# Patient Record
Sex: Female | Born: 1935 | Race: White | Hispanic: No | Marital: Married | Smoking: Never smoker
Health system: Southern US, Community
[De-identification: ages and names within clinical notes are randomized; demographics above are authoritative.]

## PROBLEM LIST (undated history)

## (undated) DIAGNOSIS — N3090 Cystitis, unspecified without hematuria: Secondary | ICD-10-CM

## (undated) DIAGNOSIS — F0280 Dementia in other diseases classified elsewhere without behavioral disturbance: Secondary | ICD-10-CM

## (undated) DIAGNOSIS — K219 Gastro-esophageal reflux disease without esophagitis: Secondary | ICD-10-CM

## (undated) DIAGNOSIS — I10 Essential (primary) hypertension: Secondary | ICD-10-CM

## (undated) DIAGNOSIS — I471 Supraventricular tachycardia: Secondary | ICD-10-CM

## (undated) DIAGNOSIS — F419 Anxiety disorder, unspecified: Secondary | ICD-10-CM

## (undated) DIAGNOSIS — G2581 Restless legs syndrome: Secondary | ICD-10-CM

## (undated) HISTORY — PX: ABDOMINAL HYSTERECTOMY: SHX81

## (undated) HISTORY — PX: NASAL SINUS SURGERY: SHX719

---

## 2004-11-05 ENCOUNTER — Ambulatory Visit: Payer: Self-pay | Admitting: Internal Medicine

## 2004-11-08 ENCOUNTER — Ambulatory Visit: Payer: Self-pay | Admitting: Internal Medicine

## 2005-07-28 ENCOUNTER — Ambulatory Visit: Payer: Self-pay | Admitting: Internal Medicine

## 2005-09-11 ENCOUNTER — Ambulatory Visit: Payer: Self-pay | Admitting: Internal Medicine

## 2005-10-29 ENCOUNTER — Ambulatory Visit: Payer: Self-pay | Admitting: Unknown Physician Specialty

## 2006-01-29 ENCOUNTER — Ambulatory Visit: Payer: Self-pay | Admitting: Pain Medicine

## 2006-09-22 ENCOUNTER — Ambulatory Visit: Payer: Self-pay | Admitting: Internal Medicine

## 2006-10-01 ENCOUNTER — Observation Stay (HOSPITAL_COMMUNITY): Admission: EM | Admit: 2006-10-01 | Discharge: 2006-10-02 | Payer: Self-pay | Admitting: Emergency Medicine

## 2007-11-24 ENCOUNTER — Ambulatory Visit: Payer: Self-pay

## 2008-05-17 ENCOUNTER — Ambulatory Visit: Payer: Self-pay | Admitting: Internal Medicine

## 2008-06-29 ENCOUNTER — Ambulatory Visit: Payer: Self-pay | Admitting: Unknown Physician Specialty

## 2008-08-21 ENCOUNTER — Ambulatory Visit: Payer: Self-pay | Admitting: Unknown Physician Specialty

## 2009-02-26 ENCOUNTER — Ambulatory Visit: Payer: Self-pay | Admitting: Otolaryngology

## 2009-02-28 ENCOUNTER — Ambulatory Visit: Payer: Self-pay | Admitting: Otolaryngology

## 2010-06-25 ENCOUNTER — Ambulatory Visit: Payer: Self-pay | Admitting: Internal Medicine

## 2011-07-07 ENCOUNTER — Emergency Department: Payer: Self-pay | Admitting: Unknown Physician Specialty

## 2011-10-11 ENCOUNTER — Emergency Department: Payer: Self-pay | Admitting: *Deleted

## 2014-04-19 DIAGNOSIS — M810 Age-related osteoporosis without current pathological fracture: Secondary | ICD-10-CM | POA: Insufficient documentation

## 2014-05-23 ENCOUNTER — Ambulatory Visit: Payer: Self-pay | Admitting: Internal Medicine

## 2014-07-26 DIAGNOSIS — M519 Unspecified thoracic, thoracolumbar and lumbosacral intervertebral disc disorder: Secondary | ICD-10-CM | POA: Insufficient documentation

## 2014-08-12 ENCOUNTER — Emergency Department: Payer: Self-pay | Admitting: Emergency Medicine

## 2014-08-12 LAB — CBC
HCT: 39.2 % (ref 35.0–47.0)
HGB: 13.3 g/dL (ref 12.0–16.0)
MCH: 28.8 pg (ref 26.0–34.0)
MCHC: 33.8 g/dL (ref 32.0–36.0)
MCV: 85 fL (ref 80–100)
PLATELETS: 223 10*3/uL (ref 150–440)
RBC: 4.61 10*6/uL (ref 3.80–5.20)
RDW: 13.3 % (ref 11.5–14.5)
WBC: 7.8 10*3/uL (ref 3.6–11.0)

## 2014-08-12 LAB — BASIC METABOLIC PANEL
Anion Gap: 6 — ABNORMAL LOW (ref 7–16)
BUN: 22 mg/dL — ABNORMAL HIGH (ref 7–18)
CHLORIDE: 104 mmol/L (ref 98–107)
CO2: 30 mmol/L (ref 21–32)
CREATININE: 0.87 mg/dL (ref 0.60–1.30)
Calcium, Total: 8.5 mg/dL (ref 8.5–10.1)
EGFR (African American): >60
EGFR (Non-African Amer.): >60
GLUCOSE: 101 mg/dL — AB (ref 65–99)
OSMOLALITY: 283 (ref 275–301)
Potassium: 3.7 mmol/L (ref 3.5–5.1)
Sodium: 140 mmol/L (ref 136–145)

## 2015-06-12 DIAGNOSIS — D414 Neoplasm of uncertain behavior of bladder: Secondary | ICD-10-CM | POA: Insufficient documentation

## 2015-09-27 DIAGNOSIS — R339 Retention of urine, unspecified: Secondary | ICD-10-CM | POA: Insufficient documentation

## 2015-10-25 DIAGNOSIS — R300 Dysuria: Secondary | ICD-10-CM | POA: Insufficient documentation

## 2016-02-13 DIAGNOSIS — N3010 Interstitial cystitis (chronic) without hematuria: Secondary | ICD-10-CM | POA: Insufficient documentation

## 2016-06-16 DIAGNOSIS — M1990 Unspecified osteoarthritis, unspecified site: Secondary | ICD-10-CM | POA: Insufficient documentation

## 2016-08-11 DIAGNOSIS — Z0000 Encounter for general adult medical examination without abnormal findings: Secondary | ICD-10-CM | POA: Insufficient documentation

## 2016-12-09 DIAGNOSIS — M419 Scoliosis, unspecified: Secondary | ICD-10-CM | POA: Insufficient documentation

## 2017-09-22 DIAGNOSIS — I471 Supraventricular tachycardia: Secondary | ICD-10-CM | POA: Insufficient documentation

## 2018-06-15 DIAGNOSIS — F02818 Dementia in other diseases classified elsewhere, unspecified severity, with other behavioral disturbance: Secondary | ICD-10-CM | POA: Insufficient documentation

## 2018-06-15 DIAGNOSIS — F0281 Dementia in other diseases classified elsewhere with behavioral disturbance: Secondary | ICD-10-CM | POA: Insufficient documentation

## 2018-06-15 DIAGNOSIS — G309 Alzheimer's disease, unspecified: Secondary | ICD-10-CM | POA: Insufficient documentation

## 2018-07-01 ENCOUNTER — Other Ambulatory Visit: Payer: Self-pay

## 2018-07-01 ENCOUNTER — Observation Stay
Admission: EM | Admit: 2018-07-01 | Discharge: 2018-07-05 | Disposition: A | Discharge status: Skilled Nursing Facility | Payer: Medicare Other | Attending: Internal Medicine | Admitting: Internal Medicine

## 2018-07-01 ENCOUNTER — Emergency Department: Payer: Medicare Other

## 2018-07-01 DIAGNOSIS — Z791 Long term (current) use of non-steroidal anti-inflammatories (NSAID): Secondary | ICD-10-CM | POA: Insufficient documentation

## 2018-07-01 DIAGNOSIS — Z66 Do not resuscitate: Secondary | ICD-10-CM | POA: Diagnosis not present

## 2018-07-01 DIAGNOSIS — B965 Pseudomonas (aeruginosa) (mallei) (pseudomallei) as the cause of diseases classified elsewhere: Secondary | ICD-10-CM | POA: Insufficient documentation

## 2018-07-01 DIAGNOSIS — Z79899 Other long term (current) drug therapy: Secondary | ICD-10-CM | POA: Insufficient documentation

## 2018-07-01 DIAGNOSIS — M79672 Pain in left foot: Secondary | ICD-10-CM | POA: Insufficient documentation

## 2018-07-01 DIAGNOSIS — K219 Gastro-esophageal reflux disease without esophagitis: Secondary | ICD-10-CM | POA: Diagnosis not present

## 2018-07-01 DIAGNOSIS — F419 Anxiety disorder, unspecified: Secondary | ICD-10-CM | POA: Insufficient documentation

## 2018-07-01 DIAGNOSIS — R296 Repeated falls: Secondary | ICD-10-CM | POA: Diagnosis present

## 2018-07-01 DIAGNOSIS — M79605 Pain in left leg: Secondary | ICD-10-CM

## 2018-07-01 DIAGNOSIS — I10 Essential (primary) hypertension: Secondary | ICD-10-CM | POA: Diagnosis not present

## 2018-07-01 DIAGNOSIS — M7989 Other specified soft tissue disorders: Secondary | ICD-10-CM

## 2018-07-01 DIAGNOSIS — S83282A Other tear of lateral meniscus, current injury, left knee, initial encounter: Secondary | ICD-10-CM | POA: Insufficient documentation

## 2018-07-01 DIAGNOSIS — M25562 Pain in left knee: Secondary | ICD-10-CM | POA: Diagnosis present

## 2018-07-01 DIAGNOSIS — S83242A Other tear of medial meniscus, current injury, left knee, initial encounter: Principal | ICD-10-CM | POA: Insufficient documentation

## 2018-07-01 DIAGNOSIS — N390 Urinary tract infection, site not specified: Secondary | ICD-10-CM | POA: Diagnosis present

## 2018-07-01 DIAGNOSIS — M1712 Unilateral primary osteoarthritis, left knee: Secondary | ICD-10-CM | POA: Insufficient documentation

## 2018-07-01 DIAGNOSIS — W19XXXA Unspecified fall, initial encounter: Secondary | ICD-10-CM | POA: Insufficient documentation

## 2018-07-01 DIAGNOSIS — G2581 Restless legs syndrome: Secondary | ICD-10-CM | POA: Diagnosis present

## 2018-07-01 DIAGNOSIS — N3090 Cystitis, unspecified without hematuria: Secondary | ICD-10-CM | POA: Insufficient documentation

## 2018-07-01 HISTORY — DX: Gastro-esophageal reflux disease without esophagitis: K21.9

## 2018-07-01 HISTORY — DX: Cystitis, unspecified without hematuria: N30.90

## 2018-07-01 HISTORY — DX: Essential (primary) hypertension: I10

## 2018-07-01 HISTORY — DX: Restless legs syndrome: G25.81

## 2018-07-01 MED ORDER — ALPRAZOLAM 0.5 MG PO TABS
0.5000 mg | ORAL_TABLET | Freq: Once | ORAL | Status: AC
  Administered 2018-07-01: 0.5 mg via ORAL
  Filled 2018-07-01: qty 1

## 2018-07-01 MED ORDER — PRAMIPEXOLE DIHYDROCHLORIDE 0.25 MG PO TABS
0.2500 mg | ORAL_TABLET | Freq: Three times a day (TID) | ORAL | Status: DC
  Administered 2018-07-01 – 2018-07-02 (×2): 0.25 mg via ORAL
  Filled 2018-07-01 (×3): qty 1

## 2018-07-01 MED ORDER — TRAMADOL HCL 50 MG PO TABS
100.0000 mg | ORAL_TABLET | Freq: Once | ORAL | Status: AC
  Administered 2018-07-01: 100 mg via ORAL
  Filled 2018-07-01: qty 2

## 2018-07-01 NOTE — ED Notes (Signed)
Pt reports left knee/calf pain that began 3 days ago. Pt denies recent injury. Mild swelling to left calf. Pt denies blood thinners. Pt reports falling a couple months ago but was not injured in the fall. Pt unable to bear weight on her leg. Pt had imaging yesterday and was told she had a small patellar fracture to left knee. Pt daughter is at the bedside and reports that the pt lives at home with her husband who is also elderly and is a fall risk as well. The daughter is concerned for the pt safety at home. Notified MD Archie Balboa of families concern.

## 2018-07-01 NOTE — ED Provider Notes (Signed)
[facility] Emergency Department Provider Note  ____________________________________________   I have reviewed the triage vital signs and the nursing notes.   HISTORY  Chief Complaint Knee Pain   History limited by: Not Limited, some history obtained from family   HPI Paige Vasquez is a 84 y.o. female who presents to the emergency department today because of concerns for left leg pain and desire for rehab placement.  Family states that the patient had a fall a couple of weeks ago.  Had had some difficulty with ambulation however the past 2 or 3 days have been acutely worse.  Family also noticed some swelling to the left leg.  Got outpatient x-rays done which were concerning for patellar fracture.  Was seen at [facility] emergency department earlier today who recommended orthopedic follow-up.  Patient does not have any history of blood clots.   Per medical record review patient has a history of cystitis  Past Medical History:  Diagnosis Date  . Cystitis     There are no active problems to display for this patient.   History reviewed. No pertinent surgical history.  Prior to Admission medications   Not on File    Allergies Patient has no known allergies.  No family history on file.  Social History Social History   Tobacco Use  . Smoking status: Never Smoker  . Smokeless tobacco: Never Used  Substance Use Topics  . Alcohol use: Not on file  . Drug use: Not on file    Review of Systems Constitutional: No fever/chills Eyes: No visual changes. ENT: No sore throat. Cardiovascular: Denies chest pain. Respiratory: Denies shortness of breath. Gastrointestinal: No abdominal pain.  No nausea, no vomiting.  No diarrhea.   Genitourinary: Negative for dysuria. Musculoskeletal: Positive for left lower leg pain Skin: Negative for rash. Neurological: Negative for headaches, focal weakness or  numbness.  ____________________________________________   PHYSICAL EXAM:  VITAL SIGNS: ED Triage Vitals  Enc Vitals Group     BP 07/01/18 2030 (!) 177/52     Pulse Rate 07/01/18 2030 66     Resp 07/01/18 2030 18     Temp 07/01/18 2030 98.2 F (36.8 C)     Temp Source 07/01/18 2030 Oral     SpO2 07/01/18 2030 97 %     Weight 07/01/18 2031 130 lb (59 kg)     Height 07/01/18 2031 5\' 3"  (1.6 m)     Head Circumference --      Peak Flow --      Pain Score 07/01/18 2030 9   Constitutional: Alert and oriented.  Eyes: Conjunctivae are normal.  ENT      Head: Normocephalic and atraumatic.      Nose: No congestion/rhinnorhea.      Mouth/Throat: Mucous membranes are moist.      Neck: No stridor. Hematological/Lymphatic/Immunilogical: No cervical lymphadenopathy. Cardiovascular: Normal rate, regular rhythm.  No murmurs, rubs, or gallops.  Respiratory: Normal respiratory effort without tachypnea nor retractions. Breath sounds are clear and equal bilaterally. No wheezes/rales/rhonchi. Gastrointestinal: Soft and non tender. No rebound. No guarding.  Genitourinary: Deferred Musculoskeletal: Normal range of motion in all extremities.  2+ left lower leg edema some tenderness to palpation of the left lower leg Neurologic:  Normal speech and language. No gross focal neurologic deficits are appreciated.  Skin:  Skin is warm, dry and intact. No rash noted. Psychiatric: Mood and affect are normal. Speech and behavior are normal. Patient exhibits appropriate insight and judgment.  ____________________________________________  LABS (pertinent positives/negatives)  Labs pending  ____________________________________________   EKG  None  ____________________________________________    RADIOLOGY  Left knee No obvious fracture  [place] No DVT   ____________________________________________   PROCEDURES  Procedures  ____________________________________________   INITIAL  IMPRESSION / ASSESSMENT AND PLAN / ED COURSE  Pertinent labs & imaging results that were available during my care of the patient were reviewed by me and considered in my medical decision making (see chart for details).   Patient presented to the emergency department accompanied by family because concerns for placement after diagnosis of left patellar fracture.  On exam patient does have swelling to her left lower leg.  It is more than I would expect with just patellar fracture.  X-rays here do not show any obvious acute fracture.  Ultrasound was obtained to assess for DVT.  This also was negative.  While she was here in the emergency department started complaining of urinary burning.  Urine and blood work was obtained.  Discussed with family that if we do not find any obvious etiology that would warrant admission we will have social work evaluate the morning.   ____________________________________________   FINAL CLINICAL IMPRESSION(S) / ED DIAGNOSES  Left leg pain Left leg swelling  Note: This dictation was prepared with Dragon dictation. Any transcriptional errors that result from this process are unintentional     Nance Pear, MD 07/02/18 0013

## 2018-07-01 NOTE — ED Triage Notes (Addendum)
Pt arrives to ED via POV from home with c/o left knee pain. Pt's daughter states pt was seen yesterday at [facility], had imaging performed, and told she "has a broken knee cap". Daughter states unknown when or how injury occurred. CMS intact in lower left leg, no obvious deformity or dislocation noted.

## 2018-07-01 NOTE — ED Notes (Signed)
Pt reports burning with urination, UA obtained via bed pan. MD Archie Balboa made aware of pt complaints.

## 2018-07-02 ENCOUNTER — Encounter: Payer: Self-pay | Admitting: Internal Medicine

## 2018-07-02 ENCOUNTER — Other Ambulatory Visit: Payer: Self-pay

## 2018-07-02 ENCOUNTER — Observation Stay: Payer: Medicare Other

## 2018-07-02 DIAGNOSIS — G2581 Restless legs syndrome: Secondary | ICD-10-CM | POA: Diagnosis present

## 2018-07-02 DIAGNOSIS — N390 Urinary tract infection, site not specified: Secondary | ICD-10-CM | POA: Diagnosis present

## 2018-07-02 DIAGNOSIS — I10 Essential (primary) hypertension: Secondary | ICD-10-CM | POA: Diagnosis present

## 2018-07-02 DIAGNOSIS — K219 Gastro-esophageal reflux disease without esophagitis: Secondary | ICD-10-CM | POA: Diagnosis present

## 2018-07-02 DIAGNOSIS — W19XXXA Unspecified fall, initial encounter: Secondary | ICD-10-CM | POA: Diagnosis present

## 2018-07-02 LAB — CBC WITH DIFFERENTIAL/PLATELET
BASOS ABS: 0 10*3/uL (ref 0–0.1)
BASOS PCT: 1 %
EOS ABS: 0 10*3/uL (ref 0–0.7)
EOS PCT: 0 %
HEMATOCRIT: 38.8 % (ref 35.0–47.0)
Hemoglobin: 13.4 g/dL (ref 12.0–16.0)
Lymphocytes Relative: 13 %
Lymphs Abs: 1.1 10*3/uL (ref 1.0–3.6)
MCH: 29.2 pg (ref 26.0–34.0)
MCHC: 34.5 g/dL (ref 32.0–36.0)
MCV: 84.8 fL (ref 80.0–100.0)
MONO ABS: 0.6 10*3/uL (ref 0.2–0.9)
Monocytes Relative: 7 %
NEUTROS ABS: 6.7 10*3/uL — AB (ref 1.4–6.5)
NEUTROS PCT: 79 %
Platelets: 215 10*3/uL (ref 150–440)
RBC: 4.58 MIL/uL (ref 3.80–5.20)
RDW: 13.1 % (ref 11.5–14.5)
WBC: 8.4 10*3/uL (ref 3.6–11.0)

## 2018-07-02 LAB — URINALYSIS, COMPLETE (UACMP) WITH MICROSCOPIC
Bilirubin Urine: NEGATIVE
GLUCOSE, UA: NEGATIVE mg/dL
Ketones, ur: NEGATIVE mg/dL
Nitrite: NEGATIVE
PH: 7 (ref 5.0–8.0)
Protein, ur: NEGATIVE mg/dL
SPECIFIC GRAVITY, URINE: 1.005 (ref 1.005–1.030)

## 2018-07-02 LAB — BASIC METABOLIC PANEL
Anion gap: 7 (ref 5–15)
BUN: 22 mg/dL (ref 8–23)
CHLORIDE: 105 mmol/L (ref 98–111)
CO2: 27 mmol/L (ref 22–32)
Calcium: 8.9 mg/dL (ref 8.9–10.3)
Creatinine, Ser: 0.79 mg/dL (ref 0.44–1.00)
GFR calc non Af Amer: >60 mL/min (ref >60)
Glucose, Bld: 94 mg/dL (ref 70–99)
Potassium: 3.6 mmol/L (ref 3.5–5.1)
Sodium: 139 mmol/L (ref 135–145)

## 2018-07-02 LAB — TROPONIN I: Troponin I: <0.03 ng/mL (ref <0.03)

## 2018-07-02 MED ORDER — MELOXICAM 7.5 MG PO TABS
7.5000 mg | ORAL_TABLET | Freq: Every day | ORAL | Status: DC
  Administered 2018-07-03 – 2018-07-05 (×3): 7.5 mg via ORAL
  Filled 2018-07-02 (×4): qty 1

## 2018-07-02 MED ORDER — SODIUM CHLORIDE 0.9 % IV SOLN
1.0000 g | Freq: Once | INTRAVENOUS | Status: AC
  Administered 2018-07-02: 1 g via INTRAVENOUS
  Filled 2018-07-02: qty 10

## 2018-07-02 MED ORDER — TRAMADOL HCL 50 MG PO TABS
100.0000 mg | ORAL_TABLET | Freq: Two times a day (BID) | ORAL | Status: DC
  Administered 2018-07-03 – 2018-07-05 (×5): 100 mg via ORAL
  Filled 2018-07-02 (×5): qty 2

## 2018-07-02 MED ORDER — TRAZODONE HCL 50 MG PO TABS
50.0000 mg | ORAL_TABLET | Freq: Every evening | ORAL | Status: DC | PRN
  Administered 2018-07-02 – 2018-07-04 (×3): 50 mg via ORAL
  Filled 2018-07-02 (×3): qty 1

## 2018-07-02 MED ORDER — ONDANSETRON HCL 4 MG/2ML IJ SOLN: 4.0000 mg | Freq: Four times a day (QID) | INTRAMUSCULAR | Status: DC | PRN

## 2018-07-02 MED ORDER — ONDANSETRON HCL 4 MG PO TABS: 4.0000 mg | ORAL_TABLET | Freq: Four times a day (QID) | ORAL | Status: DC | PRN

## 2018-07-02 MED ORDER — SODIUM CHLORIDE 0.9 % IV SOLN
1.0000 g | INTRAVENOUS | Status: DC
  Administered 2018-07-03 – 2018-07-04 (×2): 1 g via INTRAVENOUS
  Filled 2018-07-02 (×2): qty 1

## 2018-07-02 MED ORDER — TRAZODONE HCL 100 MG PO TABS
100.0000 mg | ORAL_TABLET | Freq: Every day | ORAL | Status: DC
  Administered 2018-07-02: 100 mg via ORAL
  Filled 2018-07-02: qty 1

## 2018-07-02 MED ORDER — ACETAMINOPHEN 325 MG PO TABS
650.0000 mg | ORAL_TABLET | Freq: Four times a day (QID) | ORAL | Status: DC | PRN
Start: 2018-07-02 — End: 2018-07-05

## 2018-07-02 MED ORDER — PRAMIPEXOLE DIHYDROCHLORIDE 0.25 MG PO TABS
0.2500 mg | ORAL_TABLET | Freq: Two times a day (BID) | ORAL | Status: DC
  Administered 2018-07-02 – 2018-07-05 (×7): 0.25 mg via ORAL
  Filled 2018-07-02 (×7): qty 1

## 2018-07-02 MED ORDER — TRAMADOL HCL 50 MG PO TABS
100.0000 mg | ORAL_TABLET | Freq: Once | ORAL | Status: AC
  Administered 2018-07-02: 100 mg via ORAL
  Filled 2018-07-02: qty 2

## 2018-07-02 MED ORDER — ENOXAPARIN SODIUM 40 MG/0.4ML ~~LOC~~ SOLN
40.0000 mg | SUBCUTANEOUS | Status: DC
  Administered 2018-07-02 – 2018-07-04 (×3): 40 mg via SUBCUTANEOUS
  Filled 2018-07-02 (×3): qty 0.4

## 2018-07-02 MED ORDER — ALPRAZOLAM 0.25 MG PO TABS
0.2500 mg | ORAL_TABLET | Freq: Every day | ORAL | Status: DC | PRN
  Administered 2018-07-02 – 2018-07-05 (×4): 0.25 mg via ORAL
  Filled 2018-07-02 (×4): qty 1

## 2018-07-02 MED ORDER — ACETAMINOPHEN 650 MG RE SUPP: 650.0000 mg | Freq: Four times a day (QID) | RECTAL | Status: DC | PRN

## 2018-07-02 NOTE — ED Notes (Signed)
Pt unable to tolerate external catheter so it was removed, 234ml of urine in suction canister.

## 2018-07-02 NOTE — Progress Notes (Signed)
[facility]                                                                                                                                                                                  Patient Demographics   Paige Vasquez, is a 84 y.o. female, DOB - 24-Feb-1927, YTK:[id number]  Admit date - 07/01/2018   Admitting Physician Lance Coon, MD  Outpatient Primary MD for the patient is Rusty Aus, MD   LOS - 0  Subjective: Patient admitted with left knee pain and recurrent falls.  Noted to have a urinary tract infection Patient continues to have significant pain in the left leg at the knee x-ray negative for acute fracture  Review of Systems:   CONSTITUTIONAL: No documented fever. No fatigue, weakness. No weight gain, no weight loss.  EYES: No blurry or double vision.  ENT: No tinnitus. No postnasal drip. No redness of the oropharynx.  RESPIRATORY: No cough, no wheeze, no hemoptysis. No dyspnea.  CARDIOVASCULAR: No chest pain. No orthopnea. No palpitations. No syncope.  GASTROINTESTINAL: No nausea, no vomiting or diarrhea. No abdominal pain. No melena or hematochezia.  GENITOURINARY: No dysuria or hematuria.  ENDOCRINE: No polyuria or nocturia. No heat or cold intolerance.  HEMATOLOGY: No anemia. No bruising. No bleeding.  INTEGUMENTARY: No rashes. No lesions.  MUSCULOSKELETAL: No arthritis. No swelling. No gout.  Positive left knee pain NEUROLOGIC: No numbness, tingling, or ataxia. No seizure-type activity.  PSYCHIATRIC: No anxiety. No insomnia. No ADD.    Vitals:   Vitals:   07/02/18 0425 07/02/18 0550 07/02/18 0623 07/02/18 1145  BP: (!) 139/57 (!) 132/57 (!) 173/65 (!) 141/70  Pulse: (!) 58 (!) 48 (!) 48 74  Resp: 20 17  17   Temp:   98.3 F (36.8 C) 98 F (36.7 C)  TempSrc:   Oral Oral  SpO2: 97% 94% 96% 96%  Weight:      Height:        Wt Readings from Last 3 Encounters:  07/01/18 59 kg (130 lb)     Intake/Output  Summary (Last 24 hours) at 07/02/2018 1426 Last data filed at 07/02/2018 1043 Gross per 24 hour  Intake 0 ml  Output -  Net 0 ml    Physical Exam:   GENERAL: Pleasant-appearing in no apparent distress.  HEAD, EYES, EARS, NOSE AND THROAT: Atraumatic, normocephalic. Extraocular muscles are intact. Pupils equal and reactive to light. Sclerae anicteric. No conjunctival injection. No oro-pharyngeal erythema.  NECK: Supple. There is no jugular venous distention. No bruits, no lymphadenopathy, no thyromegaly.  HEART: Regular rate and rhythm,. No murmurs, no rubs, no clicks.  LUNGS: Clear to auscultation  bilaterally. No rales or rhonchi. No wheezes.  ABDOMEN: Soft, flat, nontender, nondistended. Has good bowel sounds. No hepatosplenomegaly appreciated.  EXTREMITIES: Difficulty with moving left leg with flexion and extension NEUROLOGIC: The patient is alert, awake, and oriented x3 with no focal motor or sensory deficits appreciated bilaterally.  SKIN: Moist and warm with no rashes appreciated.  Psych: Not anxious, depressed LN: No inguinal LN enlargement    Antibiotics   Anti-infectives (From admission, onward)   Start     Dose/Rate Route Frequency Ordered Stop   07/03/18 0430  cefTRIAXone (ROCEPHIN) 1 g in sodium chloride 0.9 % 100 mL IVPB     1 g 200 mL/hr over 30 Minutes Intravenous Every 24 hours 07/02/18 0619     07/02/18 0430  cefTRIAXone (ROCEPHIN) 1 g in sodium chloride 0.9 % 100 mL IVPB     1 g 200 mL/hr over 30 Minutes Intravenous  Once 07/02/18 0415 07/02/18 0453      Medications   Scheduled Meds: . enoxaparin (LOVENOX) injection  40 mg Subcutaneous Q24H  . meloxicam  7.5 mg Oral Daily  . pramipexole  0.25 mg Oral BID  . [START ON 07/03/2018] traMADol  100 mg Oral BID   Continuous Infusions: . [START ON 07/03/2018] cefTRIAXone (ROCEPHIN)  IV     PRN Meds:.acetaminophen **OR** acetaminophen, ALPRAZolam, ondansetron **OR** ondansetron (ZOFRAN) IV, traZODone   Data  Review:   Micro Results No results found for this or any previous visit (from the past 240 hour(s)).  Radiology Reports US Venous Img Lower Unilateral Left  Result Date: 07/01/2018 CLINICAL DATA:  Pain and swelling of the left lower extremity. EXAM: LEFT LOWER EXTREMITY VENOUS DOPPLER ULTRASOUND TECHNIQUE: Gray-scale sonography with graded compression, as well as color Doppler and duplex ultrasound were performed to evaluate the lower extremity deep venous systems from the level of the common femoral vein and including the common femoral, femoral, profunda femoral, popliteal and calf veins including the posterior tibial, peroneal and gastrocnemius veins when visible. The superficial great saphenous vein was also interrogated. Spectral Doppler was utilized to evaluate flow at rest and with distal augmentation maneuvers in the common femoral, femoral and popliteal veins. COMPARISON:  None. FINDINGS: Contralateral Common Femoral Vein: Respiratory phasicity is normal and symmetric with the symptomatic side. No evidence of thrombus. Normal compressibility. Common Femoral Vein: No evidence of thrombus. Normal compressibility, respiratory phasicity and response to augmentation. Saphenofemoral Junction: No evidence of thrombus. Normal compressibility and flow on color Doppler imaging. Profunda Femoral Vein: No evidence of thrombus. Normal compressibility and flow on color Doppler imaging. Femoral Vein: No evidence of thrombus. Normal compressibility, respiratory phasicity and response to augmentation. Popliteal Vein: No evidence of thrombus. Normal compressibility, respiratory phasicity and response to augmentation. Calf Veins: No evidence of thrombus. Normal compressibility and flow on color Doppler imaging. Superficial Great Saphenous Vein: No evidence of thrombus. Normal compressibility. Venous Reflux:  None. Other Findings:  None. IMPRESSION: No evidence of deep venous thrombosis. Electronically Signed   By:  Fidela Salisbury M.D.   On: 07/01/2018 23:00   Dg Knee Complete 4 Views Left  Result Date: 07/01/2018 CLINICAL DATA:  Left knee pain, daughter states patient had a broken knee cap on imaging performed elsewhere though is unsure how that could have happened. EXAM: LEFT KNEE - COMPLETE 4+ VIEW COMPARISON:  09/30/2006 FINDINGS: Mild soft tissue swelling and edema is seen about the left knee. Trace joint effusion. Femorotibial and patellofemoral joint space narrowing is identified. Slight shallow depression of the lateral  tibial plateau is noted that could potentially represent a subtle lateral tibial plateau depression fracture. However given lack of significant joint fluid, this is believed less likely or may be chronic. IMPRESSION: 1. No acute fracture is identified especially in reference to the patella. There is minimal spurring off the upper pole of the patella which is slightly fragmented and could have been mistaken for patellar fracture. This appears stable relative to a prior study from 09/30/2006. 2. Subtle shallow depression of the lateral tibial plateaus is suggested on one view that could reflect old remote trauma given lack of significant joint fluid or is due to projection. CT or MRI may help for better assessment if clinically believed necessary. 3. Trace joint effusion. 4. Mild periarticular soft tissue edema. Electronically Signed   By: Ashley Royalty M.D.   On: 07/01/2018 21:56     CBC Recent Labs  Lab 07/02/18 0032  WBC 8.4  HGB 13.4  HCT 38.8  PLT 215  MCV 84.8  MCH 29.2  MCHC 34.5  RDW 13.1  LYMPHSABS 1.1  MONOABS 0.6  EOSABS 0.0  BASOSABS 0.0    Chemistries  Recent Labs  Lab 07/02/18 0032  NA 139  K 3.6  CL 105  CO2 27  GLUCOSE 94  BUN 22  CREATININE 0.79  CALCIUM 8.9   ------------------------------------------------------------------------------------------------------------------ estimated creatinine clearance is 40.2 mL/min (by C-G formula based on SCr  of 0.79 mg/dL). ------------------------------------------------------------------------------------------------------------------ No results for input(s): HGBA1C in the last 72 hours. ------------------------------------------------------------------------------------------------------------------ No results for input(s): CHOL, HDL, LDLCALC, TRIG, CHOLHDL, LDLDIRECT in the last 72 hours. ------------------------------------------------------------------------------------------------------------------ No results for input(s): TSH, T4TOTAL, T3FREE, THYROIDAB in the last 72 hours.  Invalid input(s): FREET3 ------------------------------------------------------------------------------------------------------------------ No results for input(s): VITAMINB12, FOLATE, FERRITIN, TIBC, IRON, RETICCTPCT in the last 72 hours.  Coagulation profile No results for input(s): INR, PROTIME in the last 168 hours.  No results for input(s): DDIMER in the last 72 hours.  Cardiac Enzymes Recent Labs  Lab 07/02/18 0032  TROPONINI <0.03   ------------------------------------------------------------------------------------------------------------------ Invalid input(s): POCBNP    Assessment & Plan  [name] admitted with recurrent falls  1.  Left knee pain we will obtain MRI of the left knee Pain control for now  2.  UTI continue IV antibiotic  3.  Recurrent falls PT evaluation once MRI results back  4.  Hypertension continue home medication  5.  GERD continue PPI  6.  Restless legs syndrome continue home medication      Code Status Orders  (From admission, onward)        Start     Ordered   07/02/18 1200  Do not attempt resuscitation (DNR)  Continuous    Question Answer Comment  In the event of cardiac or respiratory ARREST Do not call a "code blue"   In the event of cardiac or respiratory ARREST Do not perform Intubation, CPR, defibrillation or ACLS   In  the event of cardiac or respiratory ARREST Use medication by any route, position, wound care, and other measures to relive pain and suffering. May use oxygen, suction and manual treatment of airway obstruction as needed for comfort.      07/02/18 1159    Code Status History    Date Active Date Inactive Code Status Order ID Comments User Context   07/02/2018 0619 07/02/2018 1159 Full Code [id number]  Lance Coon, MD Inpatient           Consults none  DVT Prophylaxis  Lovenox  Lab Results  Component Value Date   PLT 215 07/02/2018     Time Spent in minutes   13min Greater than 50% of time spent in care coordination and counseling patient regarding the condition and plan of care.   Dustin Flock M.D on 07/02/2018 at 2:26 PM  Between 7am to 6pm - Pager - [phone]  After 6pm go to [email or web address] - Proofreader  [employer]   Office  [phone]

## 2018-07-02 NOTE — H&P (Addendum)
[facility] NAME: Paige Vasquez    MR#:  [id number]  DATE OF BIRTH:  February 04, 1927  DATE OF ADMISSION:  07/01/2018  PRIMARY CARE PHYSICIAN: Rusty Aus, MD   REQUESTING/REFERRING PHYSICIAN: Owens Shark, MD  CHIEF COMPLAINT:   Chief Complaint  Patient presents with  . Knee Pain    HISTORY OF PRESENT ILLNESS:  Paige Vasquez  is a 84 y.o. female who presents with frequent falls at home, lower extremity weakness.  Patient is unable to contribute to her history, but family member at bedside states that the symptoms have been getting worse over the past 1 to 2 weeks.  Today the patient started complaining of dysuria as well.  Here in the ED her UA is consistent with UTI.  Hospitalist were called for admission  PAST MEDICAL HISTORY:   Past Medical History:  Diagnosis Date  . Cystitis   . GERD (gastroesophageal reflux disease)   . HTN (hypertension)   . RLS (restless legs syndrome)      PAST SURGICAL HISTORY:   Past Surgical History:  Procedure Laterality Date  . ABDOMINAL HYSTERECTOMY    . NASAL SINUS SURGERY       SOCIAL HISTORY:   Social History   Tobacco Use  . Smoking status: Never Smoker  . Smokeless tobacco: Never Used  Substance Use Topics  . Alcohol use: Not on file     FAMILY HISTORY:   Family History  Problem Relation Age of Onset  . Colon cancer Mother   . Emphysema Father      DRUG ALLERGIES:  No Known Allergies  MEDICATIONS AT HOME:   Prior to Admission medications   Medication Sig Start Date End Date Taking? Authorizing Provider  ALPRAZolam Duanne Moron) 0.25 MG tablet Take 0.25 mg by mouth daily as needed for anxiety.   Yes [provider]  Cholecalciferol (VITAMIN D-1000 MAX ST) 1000 units tablet Take 1,000 Units by mouth daily.   Yes [provider]  meloxicam (MOBIC) 7.5 MG tablet Take 7.5 mg by mouth daily.   Yes [provider]  multivitamin-lutein  (OCUVITE-LUTEIN) CAPS capsule Take 1 capsule by mouth daily.   Yes [provider]  pramipexole (MIRAPEX) 0.25 MG tablet Take 0.25 mg by mouth 2 (two) times daily.   Yes [provider]  traMADol (ULTRAM) 50 MG tablet Take 50 mg by mouth 2 (two) times daily.   Yes [provider]    REVIEW OF SYSTEMS:  Review of Systems  Unable to perform ROS: Dementia     VITAL SIGNS:   Vitals:   07/01/18 2031 07/02/18 0035 07/02/18 0100 07/02/18 0425  BP:  (!) 112/52 (!) 114/51 (!) 139/57  Pulse:  (!) 44 (!) 43 (!) 58  Resp:  18  20  Temp:      TempSrc:      SpO2:  94% 94% 97%  Weight: 59 kg (130 lb)     Height: 5\' 3"  (1.6 m)      Wt Readings from Last 3 Encounters:  07/01/18 59 kg (130 lb)    PHYSICAL EXAMINATION:  Physical Exam  Vitals reviewed. Constitutional: She appears well-developed and well-nourished. No distress.  HENT:  Head: Normocephalic and atraumatic.  Mouth/Throat: Oropharynx is clear and moist.  Eyes: Pupils are equal, round, and reactive to light. Conjunctivae and EOM are normal. No scleral icterus.  Neck: Normal range of motion. Neck supple. No JVD present. No thyromegaly present.  Cardiovascular:  Normal rate, regular rhythm and intact distal pulses. Exam reveals no gallop and no friction rub.  No murmur heard. Respiratory: Effort normal and breath sounds normal. No respiratory distress. She has no wheezes. She has no rales.  GI: Soft. Bowel sounds are normal. She exhibits no distension. There is no tenderness.  Musculoskeletal: Normal range of motion. She exhibits no edema.  No arthritis, no gout  Lymphadenopathy:    She has no cervical adenopathy.  Neurological: She is alert.  Unable to fully assess due to dementia  Skin: Skin is warm and dry. No rash noted. No erythema.  Psychiatric:  Unable to fully assess due to dementia    LABORATORY PANEL:   CBC Recent Labs  Lab 07/02/18 0032  WBC 8.4  HGB 13.4  HCT 38.8  PLT 215    ------------------------------------------------------------------------------------------------------------------  Chemistries  Recent Labs  Lab 07/02/18 0032  NA 139  K 3.6  CL 105  CO2 27  GLUCOSE 94  BUN 22  CREATININE 0.79  CALCIUM 8.9   ------------------------------------------------------------------------------------------------------------------  Cardiac Enzymes Recent Labs  Lab 07/02/18 0032  TROPONINI <0.03   ------------------------------------------------------------------------------------------------------------------  RADIOLOGY:  US Venous Img Lower Unilateral Left  Result Date: 07/01/2018 CLINICAL DATA:  Pain and swelling of the left lower extremity. EXAM: LEFT LOWER EXTREMITY VENOUS DOPPLER ULTRASOUND TECHNIQUE: Gray-scale sonography with graded compression, as well as color Doppler and duplex ultrasound were performed to evaluate the lower extremity deep venous systems from the level of the common femoral vein and including the common femoral, femoral, profunda femoral, popliteal and calf veins including the posterior tibial, peroneal and gastrocnemius veins when visible. The superficial great saphenous vein was also interrogated. Spectral Doppler was utilized to evaluate flow at rest and with distal augmentation maneuvers in the common femoral, femoral and popliteal veins. COMPARISON:  None. FINDINGS: Contralateral Common Femoral Vein: Respiratory phasicity is normal and symmetric with the symptomatic side. No evidence of thrombus. Normal compressibility. Common Femoral Vein: No evidence of thrombus. Normal compressibility, respiratory phasicity and response to augmentation. Saphenofemoral Junction: No evidence of thrombus. Normal compressibility and flow on color Doppler imaging. Profunda Femoral Vein: No evidence of thrombus. Normal compressibility and flow on color Doppler imaging. Femoral Vein: No evidence of thrombus. Normal compressibility, respiratory  phasicity and response to augmentation. Popliteal Vein: No evidence of thrombus. Normal compressibility, respiratory phasicity and response to augmentation. Calf Veins: No evidence of thrombus. Normal compressibility and flow on color Doppler imaging. Superficial Great Saphenous Vein: No evidence of thrombus. Normal compressibility. Venous Reflux:  None. Other Findings:  None. IMPRESSION: No evidence of deep venous thrombosis. Electronically Signed   By: Fidela Salisbury M.D.   On: 07/01/2018 23:00   Dg Knee Complete 4 Views Left  Result Date: 07/01/2018 CLINICAL DATA:  Left knee pain, daughter states patient had a broken knee cap on imaging performed elsewhere though is unsure how that could have happened. EXAM: LEFT KNEE - COMPLETE 4+ VIEW COMPARISON:  09/30/2006 FINDINGS: Mild soft tissue swelling and edema is seen about the left knee. Trace joint effusion. Femorotibial and patellofemoral joint space narrowing is identified. Slight shallow depression of the lateral tibial plateau is noted that could potentially represent a subtle lateral tibial plateau depression fracture. However given lack of significant joint fluid, this is believed less likely or may be chronic. IMPRESSION: 1. No acute fracture is identified especially in reference to the patella. There is minimal spurring off the upper pole of the patella which is slightly fragmented and could have been  mistaken for patellar fracture. This appears stable relative to a prior study from 09/30/2006. 2. Subtle shallow depression of the lateral tibial plateaus is suggested on one view that could reflect old remote trauma given lack of significant joint fluid or is due to projection. CT or MRI may help for better assessment if clinically believed necessary. 3. Trace joint effusion. 4. Mild periarticular soft tissue edema. Electronically Signed   By: Ashley Royalty M.D.   On: 07/01/2018 21:56    EKG:  No orders found for this or any previous  visit.  IMPRESSION AND PLAN:  Principal Problem:   UTI (urinary tract infection) -IV antibiotics started, urine culture ordered Active Problems:   Falls -potentially exacerbated by UTI, the patient could have secondary underlying cause.  We will monitor her as we treat for UTI and watch for improvement.  If she does not regain any strength she will need PT and OT consultations with possible plan for rehab or placement, pending further diagnosis   HTN (hypertension) -continue home meds   GERD (gastroesophageal reflux disease) -home dose PPI   RLS (restless legs syndrome) -continue home meds  Chart review performed and case discussed with ED provider. Labs, imaging and/or ECG reviewed by provider and discussed with patient/family. Management plans discussed with the patient and/or family.  DVT PROPHYLAXIS: SubQ lovenox  GI PROPHYLAXIS: PPI  ADMISSION STATUS: Observation  CODE STATUS: Full  TOTAL TIME TAKING CARE OF THIS PATIENT: 40 minutes.   Breylen Agyeman Searles 07/02/2018, 5:17 AM  [facility]  Office  [phone]  CC: Primary care physician; Rusty Aus, MD  Note:  This document was prepared using Dragon voice recognition software and may include unintentional dictation errors.

## 2018-07-02 NOTE — ED Notes (Signed)
Pt states she normally takes her mirapex at 5am, pt complaining of leg pain, EDP notified, states pt can take a mirapex for the pain.

## 2018-07-02 NOTE — NC FL2 (Signed)
[place] LEVEL OF CARE SCREENING TOOL     IDENTIFICATION  Patient Name: Paige Vasquez Birthdate: [birth year] Sex: female Admission Date (Current Location): 07/01/2018  [facility] and [place] Number:  Engineering geologist and Address:  [facility], [address] [place], [place] [address]      Provider Number: [id number]  Attending Physician Name and Address:  Dustin Flock, MD  Relative Name and Phone Number:       Current Level of Care: Hospital Recommended Level of Care: [place] Prior Approval Number:    Date Approved/Denied:   PASRR Number:    Discharge Plan: SNF    Current Diagnoses: Patient Active Problem List   Diagnosis Date Noted  . UTI (urinary tract infection) 07/02/2018  . Falls 07/02/2018  . HTN (hypertension) 07/02/2018  . GERD (gastroesophageal reflux disease) 07/02/2018  . RLS (restless legs syndrome) 07/02/2018    Orientation RESPIRATION BLADDER Height & Weight     Self, Situation, Place, Time  Normal Incontinent Weight: 130 lb (59 kg) Height:  5\' 3"  (160 cm)  BEHAVIORAL SYMPTOMS/MOOD NEUROLOGICAL BOWEL NUTRITION STATUS  (none) (none) Incontinent Diet  AMBULATORY STATUS COMMUNICATION OF NEEDS Skin   Limited Assist Verbally Normal                       Personal Care Assistance Level of Assistance  Bathing, Dressing, Feeding Bathing Assistance: Limited assistance Feeding assistance: Limited assistance Dressing Assistance: Limited assistance     Functional Limitations Info  (none)          SPECIAL CARE FACTORS FREQUENCY  PT (By licensed PT)                    Contractures Contractures Info: Not present    Additional Factors Info  Code Status Code Status Info: dnr             Current Medications (07/02/2018):  This is the current hospital active medication list Current Facility-Administered Medications  Medication Dose Route Frequency Provider Last Rate Last  Dose  . acetaminophen (TYLENOL) tablet 650 mg  650 mg Oral Q6H PRN Lance Coon, MD       Or  . acetaminophen (TYLENOL) suppository 650 mg  650 mg Rectal Q6H PRN Lance Coon, MD      . ALPRAZolam Duanne Moron) tablet 0.25 mg  0.25 mg Oral Daily PRN Lance Coon, MD      . Derrill Memo ON 07/03/2018] cefTRIAXone (ROCEPHIN) 1 g in sodium chloride 0.9 % 100 mL IVPB  1 g Intravenous Q24H Lance Coon, MD      . enoxaparin (LOVENOX) injection 40 mg  40 mg Subcutaneous Q24H Lance Coon, MD      . meloxicam [facility]) tablet 7.5 mg  7.5 mg Oral Daily Dustin Flock, MD      . ondansetron [facility]) tablet 4 mg  4 mg Oral Q6H PRN Lance Coon, MD       Or  . ondansetron [facility]) injection 4 mg  4 mg Intravenous Q6H PRN Lance Coon, MD      . pramipexole (MIRAPEX) tablet 0.25 mg  0.25 mg Oral BID Lance Coon, MD   0.25 mg at 07/02/18 1244  . [START ON 07/03/2018] traMADol (ULTRAM) tablet 100 mg  100 mg Oral BID Dustin Flock, MD      . traZODone (DESYREL) tablet 50 mg  50 mg Oral QHS PRN Lance Coon, MD  Discharge Medications: Please see discharge summary for a list of discharge medications.  Relevant Imaging Results:  Relevant Lab Results:   Additional Information ss: [id number]  Shela Leff, LCSW

## 2018-07-02 NOTE — Progress Notes (Signed)
Advanced care plan.  Purpose of the Encounter: CODE STATUS  Parties in [place] and daughters  Patient's Decision Capacity:intact  Subjective/Patient's story: Pt is 84 y/o with h/o gerd, htn, RLS admitted with falls and knee pain   Objective/Medical story I discussed with patient regarding her desires for cpr and intubation, accroding to daughter pt has DNR for at home wishes dnr    Goals of care determination:   dnr  CODE STATUS: dnr  Time spent discussing advanced care planning: 16 minutes

## 2018-07-02 NOTE — ED Notes (Signed)
Pt changed and given peri care, new diaper placed.

## 2018-07-02 NOTE — Care Management (Signed)
Patient admitted from home with UTI.  MRI pending.  Patient lives at home with husband.  Daughters are at bedside. PCP Sabra Heck.  Only medical equipment in the home is a cane.  PT eval pending. Family is wanting to pursue placement. RNCM informed daughter that patient would have to have recommendation from PT, and insurance authorization for placement.  RNCM informed daughter that due to patient in observation that she may be medically ready prior to obtaining insurance authorization.  RNCM discussed option of home health services.  Daughter states "we don't want in home care, we wont placement"  RNCM following for needs

## 2018-07-02 NOTE — Care Management Obs Status (Signed)
[place] NOTIFICATION   Patient Details  Name: Paige Vasquez MRN: [id number] Date of Birth: Jun 05, 1927   Medicare Observation Status Notification Given:  Yes    Beverly Sessions, RN 07/02/2018, 12:31 PM

## 2018-07-02 NOTE — ED Notes (Addendum)
Daughter [name]) is point of contact for pt and phone number is [phone].

## 2018-07-02 NOTE — ED Notes (Signed)
Pt's depends changed, peri care given and new one applied.

## 2018-07-02 NOTE — ED Notes (Signed)
Pt stating it burns when she urinates, appears uncomfortable, EDP notified.

## 2018-07-02 NOTE — ED Notes (Signed)
Pt very agitated and trying to get out of the bed.

## 2018-07-02 NOTE — ED Notes (Signed)
Pt is resting on stretcher with even, unlabored respirations. Awaiting social work consult in AM. Pt family remains at side. Call bell within reach, will continue to monitor.

## 2018-07-02 NOTE — ED Notes (Signed)
[place] applied to pt for ease of urination. Pt family remains at side.

## 2018-07-02 NOTE — Progress Notes (Signed)
PT Cancellation Note  Patient Details Name: Paige Vasquez MRN: [id number] DOB: 1927-10-29   Cancelled Treatment:    Reason Eval/Treat Not Completed: Other (comment)(waiting on ortho recs re: L knee) Spoke with nursing who states that ortho consult is going to be placed.  After we get recommendations from them regarding insufficiency fracture in L knee we will initiate PT intervention.  Will hold PT exam today.  Kreg Shropshire, DPT 07/02/2018, 4:31 PM

## 2018-07-03 NOTE — Clinical Social Work Note (Addendum)
CSW is aware that the family wants to pursue SNF; however, the PT evaluation is pending. CSW will assess once PT evaluation is complete.  UPDATE: CSW was advised that PT is recommending SNF. Per the [facility], the family has chosen [facility] for placement. The CSW has alerted [facility] who is starting the [place]. The CSW will update as more information is available.  Santiago Bumpers, MSW, Latanya Presser [phone]

## 2018-07-03 NOTE — Progress Notes (Signed)
[place]                                                                                                                                                                                  Patient Demographics   Paige Vasquez, is a 84 y.o. female, DOB - 1927-05-20, HYI:[id number]  Admit date - 07/01/2018   Admitting Physician Lance Coon, MD  Outpatient Primary MD for the patient is Rusty Aus, MD   LOS - 0  Subjective: Continues to have some left knee pain   Review of Systems:   CONSTITUTIONAL: No documented fever. No fatigue, weakness. No weight gain, no weight loss.  EYES: No blurry or double vision.  ENT: No tinnitus. No postnasal drip. No redness of the oropharynx.  RESPIRATORY: No cough, no wheeze, no hemoptysis. No dyspnea.  CARDIOVASCULAR: No chest pain. No orthopnea. No palpitations. No syncope.  GASTROINTESTINAL: No nausea, no vomiting or diarrhea. No abdominal pain. No melena or hematochezia.  GENITOURINARY: No dysuria or hematuria.  ENDOCRINE: No polyuria or nocturia. No heat or cold intolerance.  HEMATOLOGY: No anemia. No bruising. No bleeding.  INTEGUMENTARY: No rashes. No lesions.  MUSCULOSKELETAL: No arthritis. No swelling. No gout.  Positive left knee pain NEUROLOGIC: No numbness, tingling, or ataxia. No seizure-type activity.  PSYCHIATRIC: No anxiety. No insomnia. No ADD.    Vitals:   Vitals:   07/02/18 0623 07/02/18 1145 07/02/18 2015 07/03/18 0638  BP: (!) 173/65 (!) 141/70 (!) 118/55 (!) 146/58  Pulse: (!) 48 74 63 (!) 51  Resp:  17 18 16   Temp: 98.3 F (36.8 C) 98 F (36.7 C) 99.2 F (37.3 C) 97.8 F (36.6 C)  TempSrc: Oral Oral Oral Oral  SpO2: 96% 96% 96% 98%  Weight:      Height:        Wt Readings from Last 3 Encounters:  07/01/18 59 kg (130 lb)     Intake/Output Summary (Last 24 hours) at 07/03/2018 1247 Last data filed at 07/03/2018 0314 Gross per 24 hour  Intake 120 ml  Output -  Net 120  ml    Physical Exam:   GENERAL: Pleasant-appearing in no apparent distress.  HEAD, EYES, EARS, NOSE AND THROAT: Atraumatic, normocephalic. Extraocular muscles are intact. Pupils equal and reactive to light. Sclerae anicteric. No conjunctival injection. No oro-pharyngeal erythema.  NECK: Supple. There is no jugular venous distention. No bruits, no lymphadenopathy, no thyromegaly.  HEART: Regular rate and rhythm,. No murmurs, no rubs, no clicks.  LUNGS: Clear to auscultation bilaterally. No rales or rhonchi. No wheezes.  ABDOMEN: Soft, flat, nontender, nondistended. Has good bowel sounds. No hepatosplenomegaly appreciated.  EXTREMITIES:  Difficulty with moving left leg with flexion and extension has left knee swelling NEUROLOGIC: The patient is alert, awake, and oriented x3 with no focal motor or sensory deficits appreciated bilaterally.  SKIN: Moist and warm with no rashes appreciated.  Psych: Not anxious, depressed LN: No inguinal LN enlargement    Antibiotics   Anti-infectives (From admission, onward)   Start     Dose/Rate Route Frequency Ordered Stop   07/03/18 0430  cefTRIAXone (ROCEPHIN) 1 g in sodium chloride 0.9 % 100 mL IVPB     1 g 200 mL/hr over 30 Minutes Intravenous Every 24 hours 07/02/18 0619     07/02/18 0430  cefTRIAXone (ROCEPHIN) 1 g in sodium chloride 0.9 % 100 mL IVPB     1 g 200 mL/hr over 30 Minutes Intravenous  Once 07/02/18 0415 07/02/18 0453      Medications   Scheduled Meds: . enoxaparin (LOVENOX) injection  40 mg Subcutaneous Q24H  . meloxicam  7.5 mg Oral Daily  . pramipexole  0.25 mg Oral BID  . traMADol  100 mg Oral BID   Continuous Infusions: . cefTRIAXone (ROCEPHIN)  IV Stopped (07/03/18 0700)   PRN Meds:.acetaminophen **OR** acetaminophen, ALPRAZolam, ondansetron **OR** ondansetron (ZOFRAN) IV, traZODone   Data Review:   Micro Results No results found for this or any previous visit (from the past 240 hour(s)).  Radiology Reports Mr  Knee Left Wo Contrast  Result Date: 07/02/2018 CLINICAL DATA:  Multiple falls.  Lower extremity weakness. EXAM: MRI OF THE LEFT KNEE WITHOUT CONTRAST TECHNIQUE: Multiplanar, multisequence MR imaging of the knee was performed. No intravenous contrast was administered. COMPARISON:  None. FINDINGS: MENISCI Medial meniscus: Oblique tear of the posterior horn of the medial meniscus extending to the inferior articular surface. Radial tear of the posterior horn of the medial meniscus at the meniscal root. Lateral meniscus: Radial tear of the free edge of the anterior and posterior horns of the lateral meniscus. Radial tear of the body of the lateral meniscus. LIGAMENTS Cruciates: Intact ACL and PCL. ACL is increased in signal and expanded consistent with mucinous degeneration. Collaterals: Medial collateral ligament is intact. Lateral collateral ligament complex is intact. CARTILAGE Patellofemoral: Partial-thickness cartilage loss of the patellofemoral compartment. Medial: Extensive full-thickness cartilage loss of the medial femoral condyle and medial tibial plateau with subchondral reactive marrow edema. Lateral: High-grade partial-thickness cartilage loss with areas of full-thickness cartilage loss of the lateral femoral condyle and lateral tibial plateau. Joint: Moderate-sized joint effusion. Edema in Hoffa's fat. No plical thickening. Popliteal Fossa:  No Baker's cyst.  Intact popliteus tendon. Extensor Mechanism: Intact quadriceps tendon. Intact patellar tendon. Intact medial patellar retinaculum. Intact lateral patellar retinaculum. Intact MPFL. Bones: Subchondral linear low signal in the medial femoral condyle weight-bearing surface with severe surrounding marrow edema most consistent with a nondisplaced, nondepressed subchondral insufficiency fracture. No other fracture or dislocation. Other: No fluid collection or hematoma. Muscles are normal. No muscle atrophy. IMPRESSION: 1. Oblique tear of the posterior horn  of the medial meniscus extending to the inferior articular surface. Radial tear of the posterior horn of the medial meniscus at the meniscal root. 2. Radial tear of the free edge of the anterior and posterior horns of the lateral meniscus. Radial tear of the body of the lateral meniscus. 3. Nondepressed, nondisplaced subchondral insufficiency fracture of the weight-bearing surface of the medial femoral condyle with surrounding marrow edema. 4. Tricompartmental cartilage abnormalities most severe in the medial and lateral femorotibial compartments consistent with advanced osteoarthritis. Electronically Signed  By: Kathreen Devoid   On: 07/02/2018 14:44   US Venous Img Lower Unilateral Left  Result Date: 07/01/2018 CLINICAL DATA:  Pain and swelling of the left lower extremity. EXAM: LEFT LOWER EXTREMITY VENOUS DOPPLER ULTRASOUND TECHNIQUE: Gray-scale sonography with graded compression, as well as color Doppler and duplex ultrasound were performed to evaluate the lower extremity deep venous systems from the level of the common femoral vein and including the common femoral, femoral, profunda femoral, popliteal and calf veins including the posterior tibial, peroneal and gastrocnemius veins when visible. The superficial great saphenous vein was also interrogated. Spectral Doppler was utilized to evaluate flow at rest and with distal augmentation maneuvers in the common femoral, femoral and popliteal veins. COMPARISON:  None. FINDINGS: Contralateral Common Femoral Vein: Respiratory phasicity is normal and symmetric with the symptomatic side. No evidence of thrombus. Normal compressibility. Common Femoral Vein: No evidence of thrombus. Normal compressibility, respiratory phasicity and response to augmentation. Saphenofemoral Junction: No evidence of thrombus. Normal compressibility and flow on color Doppler imaging. Profunda Femoral Vein: No evidence of thrombus. Normal compressibility and flow on color Doppler imaging.  Femoral Vein: No evidence of thrombus. Normal compressibility, respiratory phasicity and response to augmentation. Popliteal Vein: No evidence of thrombus. Normal compressibility, respiratory phasicity and response to augmentation. Calf Veins: No evidence of thrombus. Normal compressibility and flow on color Doppler imaging. Superficial Great Saphenous Vein: No evidence of thrombus. Normal compressibility. Venous Reflux:  None. Other Findings:  None. IMPRESSION: No evidence of deep venous thrombosis. Electronically Signed   By: Fidela Salisbury M.D.   On: 07/01/2018 23:00   Dg Knee Complete 4 Views Left  Result Date: 07/01/2018 CLINICAL DATA:  Left knee pain, daughter states patient had a broken knee cap on imaging performed elsewhere though is unsure how that could have happened. EXAM: LEFT KNEE - COMPLETE 4+ VIEW COMPARISON:  09/30/2006 FINDINGS: Mild soft tissue swelling and edema is seen about the left knee. Trace joint effusion. Femorotibial and patellofemoral joint space narrowing is identified. Slight shallow depression of the lateral tibial plateau is noted that could potentially represent a subtle lateral tibial plateau depression fracture. However given lack of significant joint fluid, this is believed less likely or may be chronic. IMPRESSION: 1. No acute fracture is identified especially in reference to the Vasquez. There is minimal spurring off the upper pole of the Vasquez which is slightly fragmented and could have been mistaken for patellar fracture. This appears stable relative to a prior study from 09/30/2006. 2. Subtle shallow depression of the lateral tibial plateaus is suggested on one view that could reflect old remote trauma given lack of significant joint fluid or is due to projection. CT or MRI may help for better assessment if clinically believed necessary. 3. Trace joint effusion. 4. Mild periarticular soft tissue edema. Electronically Signed   By: Ashley Royalty M.D.   On: 07/01/2018  21:56     CBC Recent Labs  Lab 07/02/18 0032  WBC 8.4  HGB 13.4  HCT 38.8  PLT 215  MCV 84.8  MCH 29.2  MCHC 34.5  RDW 13.1  LYMPHSABS 1.1  MONOABS 0.6  EOSABS 0.0  BASOSABS 0.0    Chemistries  Recent Labs  Lab 07/02/18 0032  NA 139  K 3.6  CL 105  CO2 27  GLUCOSE 94  BUN 22  CREATININE 0.79  CALCIUM 8.9   ------------------------------------------------------------------------------------------------------------------ estimated creatinine clearance is 40.2 mL/min (by C-G formula based on SCr of 0.79 mg/dL). ------------------------------------------------------------------------------------------------------------------ No results for  input(s): HGBA1C in the last 72 hours. ------------------------------------------------------------------------------------------------------------------ No results for input(s): CHOL, HDL, LDLCALC, TRIG, CHOLHDL, LDLDIRECT in the last 72 hours. ------------------------------------------------------------------------------------------------------------------ No results for input(s): TSH, T4TOTAL, T3FREE, THYROIDAB in the last 72 hours.  Invalid input(s): FREET3 ------------------------------------------------------------------------------------------------------------------ No results for input(s): VITAMINB12, FOLATE, FERRITIN, TIBC, IRON, RETICCTPCT in the last 72 hours.  Coagulation profile No results for input(s): INR, PROTIME in the last 168 hours.  No results for input(s): DDIMER in the last 72 hours.  Cardiac Enzymes Recent Labs  Lab 07/02/18 0032  TROPONINI <0.03   ------------------------------------------------------------------------------------------------------------------ Invalid input(s): POCBNP    Assessment & Plan  [name] admitted with recurrent falls  1.  Left knee pain Noted to have effusion as well as tear of the ligaments in the knee Orthopedic consult is currently  pending  2.  UTI continue IV antibiotic urine culture pending  3.  Recurrent falls PT evaluation once MRI results back  4.  Hypertension continue home medication  5.  GERD continue PPI  6.  Restless legs syndrome continue home medication      Code Status Orders  (From admission, onward)        Start     Ordered   07/02/18 1200  Do not attempt resuscitation (DNR)  Continuous    Question Answer Comment  In the event of cardiac or respiratory ARREST Do not call a "code blue"   In the event of cardiac or respiratory ARREST Do not perform Intubation, CPR, defibrillation or ACLS   In the event of cardiac or respiratory ARREST Use medication by any route, position, wound care, and other measures to relive pain and suffering. May use oxygen, suction and manual treatment of airway obstruction as needed for comfort.      07/02/18 1159    Code Status History    Date Active Date Inactive Code Status Order ID Comments User Context   07/02/2018 0619 07/02/2018 1159 Full Code [id number]  Lance Coon, MD Inpatient           Consults none  DVT Prophylaxis  Lovenox    Lab Results  Component Value Date   PLT 215 07/02/2018     Time Spent in minutes   45min Greater than 50% of time spent in care coordination and counseling patient regarding the condition and plan of care.   Dustin Flock M.D on 07/03/2018 at 12:47 PM  Between 7am to 6pm - Pager - [phone]  After 6pm go to [email or web address] - Proofreader  [employer]   Office  [phone]

## 2018-07-03 NOTE — Evaluation (Signed)
Physical Therapy Evaluation Patient Details Name: Paige Vasquez MRN: [id number] DOB: Oct 02, 1927 Today's Date: 07/03/2018   History of Present Illness  Pt is a pleasant 84y/o female presenting to ED with hx of falls and LE weakness. Pt found to have a UTI and medial/lateral meniscus tears and insufficiency fx of medial femoral condyle of LLE. Pt must wear L knee immobilizer per ortho and watch L heel for development of pressure ulcer. PMH significant for the following: HTN, RLS, possible dementia per MD notes.   Clinical Impression  Pt eager to work with PT and had many questions about the L knee immobilizer. Pt's dtr assisted pt with hx 2/2 cognitive impairments. Pt utilized a quad cane for amb. And was IND with ADLs prior to hospitalization. Pt is the primary caregiver for her husband and family is only able to assist intermittently. Pt required min A during bed mobility, STS txfs, and ambulation with RW 2/2 weakness and pain. Pt is WBAT on LLE, but is unable to tolerate weight shifting onto LLE, which allows pt to only shuffle RLE forward/backwards during gait (amb. 3' back and forth). The following deficits were noted upon exam: gait deviations, pain, decr. Strength, impaired balance, and decr. Endurance. PT recommending SNF at this time. Pt would benefit from skilled PT to address above deficits and promote optimal return to PLOF.    Follow Up Recommendations SNF    Equipment Recommendations  None recommended by PT    Recommendations for Other Services       Precautions / Restrictions Precautions Precautions: Fall Required Braces or Orthoses: Knee Immobilizer - Left Knee Immobilizer - Left: On at all times(per nursing) Restrictions Weight Bearing Restrictions: Yes LLE Weight Bearing: Weight bearing as tolerated Other Position/Activity Restrictions: keep heels elevated and off bed.      Mobility  Bed Mobility Overal bed mobility: Needs Assistance Bed Mobility: Sit to  Supine;Supine to Sit     Supine to sit: Min assist Sit to supine: Min assist   General bed mobility comments: Min A to guide LLE in/off EOB. Cues for hand placement and weight shifting to scoot towards EOB.  Transfers Overall transfer level: Needs assistance Equipment used: Rolling walker (2 wheeled) Transfers: Sit to/from Stand Sit to Stand: Min assist         General transfer comment: Min A with RW. Cues for hand placement and technique.   Ambulation/Gait Ambulation/Gait assistance: Min assist Gait Distance (Feet): 3 Feet Assistive device: Rolling walker (2 wheeled) Gait Pattern/deviations: Shuffle;Decreased weight shift to left     General Gait Details: Pt unable to step with RLE 2/2 limited ability to place weight on LLE 2/2 pain. Pt could swing LLE forward/backwards, and would slid RLE forward/backward. Pt amb. 3' forward/3' backwards before requiring rest break 2/2 fatigue and pain.   Stairs            Wheelchair Mobility    Modified Rankin (Stroke Patients Only)       Balance Overall balance assessment: Needs assistance Sitting-balance support: Bilateral upper extremity supported;Feet supported Sitting balance-Leahy Scale: Good     Standing balance support: Bilateral upper extremity supported Standing balance-Leahy Scale: Poor Standing balance comment: BUE support on RW with limited weight bearing on LLE 2/2 pain                             Pertinent Vitals/Pain Pain Assessment: Faces Faces Pain Scale: Hurts little more Pain Location: L knee  during standing, but no pain at rest in supine Pain Descriptors / Indicators: Guarding;Grimacing Pain Intervention(s): Repositioned;Limited activity within patient's tolerance;Monitored during session(RN present during session, and gave pt anxiety meds)    Home Living Family/patient expects to be discharged to:: Skilled nursing facility Living Arrangements: Spouse/significant other Available Help  at Discharge: Family;Available PRN/intermittently Type of Home: House Home Access: Stairs to enter Entrance Stairs-Rails: Left Entrance Stairs-Number of Steps: 3 Home Layout: One level Home Equipment: Cane - quad Additional Comments: Pt is the primary caregiver for her husband who has limited mobility.    Prior Function Level of Independence: Independent with assistive device(s)               Hand Dominance        Extremity/Trunk Assessment   Upper Extremity Assessment Upper Extremity Assessment: Generalized weakness    Lower Extremity Assessment Lower Extremity Assessment: RLE deficits/detail;LLE deficits/detail RLE Deficits / Details: RLE weakness (3+/5 to 4/5) during hip flexion, knee ext/flex and ankle movements. Denied N/T LLE Deficits / Details: Unable to perform MMT 2/2 pain and L knee immobilizer but weakness suspected based on hx and ROM limited. No c/o N/T       Communication   Communication: No difficulties  Cognition Arousal/Alertness: Awake/alert Behavior During Therapy: WFL for tasks assessed/performed Overall Cognitive Status: History of cognitive impairments - at baseline                                        General Comments      Exercises     Assessment/Plan    PT Assessment Patient needs continued PT services  PT Problem List Decreased strength;Decreased range of motion;Decreased activity tolerance;Decreased balance;Decreased mobility;Decreased knowledge of use of DME;Decreased cognition;Decreased knowledge of precautions;Pain       PT Treatment Interventions DME instruction;Gait training;Stair training;Functional mobility training;Therapeutic activities;Therapeutic exercise;Balance training;Neuromuscular re-education;Patient/family education    PT Goals (Current goals can be found in the Care Plan section)  Acute Rehab PT Goals Patient Stated Goal: To get better PT Goal Formulation: With patient Time For Goal  Achievement: 07/17/18 Potential to Achieve Goals: Good    Frequency 7X/week   Barriers to discharge Decreased caregiver support Pt is the primary caregiver for her husband and family is available intermittently.    Co-evaluation               AM-PAC PT "6 Clicks" Daily Activity  Outcome Measure Difficulty turning over in bed (including adjusting bedclothes, sheets and blankets)?: A Little Difficulty moving from lying on back to sitting on the side of the bed? : A Little Difficulty sitting down on and standing up from a chair with arms (e.g., wheelchair, bedside commode, etc,.)?: A Little Help needed moving to and from a bed to chair (including a wheelchair)?: A Little Help needed walking in hospital room?: A Lot Help needed climbing 3-5 steps with a railing? : A Lot 6 Click Score: 16    End of Session Equipment Utilized During Treatment: Gait belt Activity Tolerance: Patient limited by pain;Patient tolerated treatment well Patient left: in bed;with call bell/phone within reach;with bed alarm set;with family/visitor present Nurse Communication: Mobility status;Other (comment)(SNF recommendation) PT Visit Diagnosis: Unsteadiness on feet (R26.81);Other abnormalities of gait and mobility (R26.89);Muscle weakness (generalized) (M62.81);History of falling (Z91.81);Pain Pain - Right/Left: Left Pain - part of body: Knee    Time: 9767-3419 PT Time Calculation (min) (ACUTE ONLY): 23  min   Charges:   PT Evaluation $PT Eval Low Complexity: 1 Low PT Treatments $Therapeutic Activity: 8-22 mins   PT G Codes:        Geoffry Paradise, PT,DPT 07/03/18 3:36 PM    Gordy Goar L 07/03/2018, 3:33 PM

## 2018-07-03 NOTE — Consult Note (Addendum)
ORTHOPAEDIC CONSULTATION  REQUESTING PHYSICIAN: Dustin Flock, MD  Chief Complaint: left knee pain  HPI: Paige Vasquez is a 84 y.o. female who complains of  Left knee and heel pain. Please see H&P and ED notes for details. She saw my partner Dr. Sabra Heck this past Wednesday and had a left knee cortisone injection. No numbness or tingling, no constitutional symptoms.  Past Medical History:  Diagnosis Date  . Cystitis   . GERD (gastroesophageal reflux disease)   . HTN (hypertension)   . RLS (restless legs syndrome)    Past Surgical History:  Procedure Laterality Date  . ABDOMINAL HYSTERECTOMY    . NASAL SINUS SURGERY     Social History   Socioeconomic History  . Marital status: Married    Spouse name: Not on file  . Number of children: Not on file  . Years of education: Not on file  . Highest education level: Not on file  Occupational History  . Not on file  Social Needs  . Financial resource strain: Not on file  . Food insecurity:    Worry: Not on file    Inability: Not on file  . Transportation needs:    Medical: Not on file    Non-medical: Not on file  Tobacco Use  . Smoking status: Never Smoker  . Smokeless tobacco: Never Used  Substance and Sexual Activity  . Alcohol use: Not on file  . Drug use: Not on file  . Sexual activity: Not on file  Lifestyle  . Physical activity:    Days per week: Not on file    Minutes per session: Not on file  . Stress: Not on file  Relationships  . Social connections:    Talks on phone: Not on file    Gets together: Not on file    Attends religious service: Not on file    Active member of club or organization: Not on file    Attends meetings of clubs or organizations: Not on file    Relationship status: Not on file  Other Topics Concern  . Not on file  Social History Narrative  . Not on file   Family History  Problem Relation Age of Onset  . Colon cancer Mother   . Emphysema Father    No Known Allergies Prior to  Admission medications   Medication Sig Start Date End Date Taking? Authorizing Provider  ALPRAZolam Duanne Moron) 0.25 MG tablet Take 0.25 mg by mouth daily as needed for anxiety.   Yes [provider]  Cholecalciferol (VITAMIN D-1000 MAX ST) 1000 units tablet Take 1,000 Units by mouth daily.   Yes [provider]  meloxicam (MOBIC) 7.5 MG tablet Take 7.5 mg by mouth daily.   Yes [provider]  multivitamin-lutein (OCUVITE-LUTEIN) CAPS capsule Take 1 capsule by mouth daily.   Yes [provider]  pramipexole (MIRAPEX) 0.25 MG tablet Take 0.25 mg by mouth 2 (two) times daily.   Yes [provider]  traMADol (ULTRAM) 50 MG tablet Take 50 mg by mouth 2 (two) times daily.   Yes [provider]   Mr Knee Left Wo Contrast  Result Date: 07/02/2018 CLINICAL DATA:  Multiple falls.  Lower extremity weakness. EXAM: MRI OF THE LEFT KNEE WITHOUT CONTRAST TECHNIQUE: Multiplanar, multisequence MR imaging of the knee was performed. No intravenous contrast was administered. COMPARISON:  None. FINDINGS: MENISCI Medial meniscus: Oblique tear of the posterior horn of the medial meniscus extending to the inferior articular surface. Radial tear of  the posterior horn of the medial meniscus at the meniscal root. Lateral meniscus: Radial tear of the free edge of the anterior and posterior horns of the lateral meniscus. Radial tear of the body of the lateral meniscus. LIGAMENTS Cruciates: Intact ACL and PCL. ACL is increased in signal and expanded consistent with mucinous degeneration. Collaterals: Medial collateral ligament is intact. Lateral collateral ligament complex is intact. CARTILAGE Patellofemoral: Partial-thickness cartilage loss of the patellofemoral compartment. Medial: Extensive full-thickness cartilage loss of the medial femoral condyle and medial tibial plateau with subchondral reactive marrow edema. Lateral: High-grade partial-thickness cartilage loss with areas of  full-thickness cartilage loss of the lateral femoral condyle and lateral tibial plateau. Joint: Moderate-sized joint effusion. Edema in Hoffa's fat. No plical thickening. Popliteal Fossa:  No Baker's cyst.  Intact popliteus tendon. Extensor Mechanism: Intact quadriceps tendon. Intact patellar tendon. Intact medial patellar retinaculum. Intact lateral patellar retinaculum. Intact MPFL. Bones: Subchondral linear low signal in the medial femoral condyle weight-bearing surface with severe surrounding marrow edema most consistent with a nondisplaced, nondepressed subchondral insufficiency fracture. No other fracture or dislocation. Other: No fluid collection or hematoma. Muscles are normal. No muscle atrophy. IMPRESSION: 1. Oblique tear of the posterior horn of the medial meniscus extending to the inferior articular surface. Radial tear of the posterior horn of the medial meniscus at the meniscal root. 2. Radial tear of the free edge of the anterior and posterior horns of the lateral meniscus. Radial tear of the body of the lateral meniscus. 3. Nondepressed, nondisplaced subchondral insufficiency fracture of the weight-bearing surface of the medial femoral condyle with surrounding marrow edema. 4. Tricompartmental cartilage abnormalities most severe in the medial and lateral femorotibial compartments consistent with advanced osteoarthritis. Electronically Signed   By: Kathreen Devoid   On: 07/02/2018 14:44   US Venous Img Lower Unilateral Left  Result Date: 07/01/2018 CLINICAL DATA:  Pain and swelling of the left lower extremity. EXAM: LEFT LOWER EXTREMITY VENOUS DOPPLER ULTRASOUND TECHNIQUE: Gray-scale sonography with graded compression, as well as color Doppler and duplex ultrasound were performed to evaluate the lower extremity deep venous systems from the level of the common femoral vein and including the common femoral, femoral, profunda femoral, popliteal and calf veins including the posterior tibial, peroneal and  gastrocnemius veins when visible. The superficial great saphenous vein was also interrogated. Spectral Doppler was utilized to evaluate flow at rest and with distal augmentation maneuvers in the common femoral, femoral and popliteal veins. COMPARISON:  None. FINDINGS: Contralateral Common Femoral Vein: Respiratory phasicity is normal and symmetric with the symptomatic side. No evidence of thrombus. Normal compressibility. Common Femoral Vein: No evidence of thrombus. Normal compressibility, respiratory phasicity and response to augmentation. Saphenofemoral Junction: No evidence of thrombus. Normal compressibility and flow on color Doppler imaging. Profunda Femoral Vein: No evidence of thrombus. Normal compressibility and flow on color Doppler imaging. Femoral Vein: No evidence of thrombus. Normal compressibility, respiratory phasicity and response to augmentation. Popliteal Vein: No evidence of thrombus. Normal compressibility, respiratory phasicity and response to augmentation. Calf Veins: No evidence of thrombus. Normal compressibility and flow on color Doppler imaging. Superficial Great Saphenous Vein: No evidence of thrombus. Normal compressibility. Venous Reflux:  None. Other Findings:  None. IMPRESSION: No evidence of deep venous thrombosis. Electronically Signed   By: Fidela Salisbury M.D.   On: 07/01/2018 23:00   Dg Knee Complete 4 Views Left  Result Date: 07/01/2018 CLINICAL DATA:  Left knee pain, daughter states patient had a broken knee cap on imaging performed elsewhere though  is unsure how that could have happened. EXAM: LEFT KNEE - COMPLETE 4+ VIEW COMPARISON:  09/30/2006 FINDINGS: Mild soft tissue swelling and edema is seen about the left knee. Trace joint effusion. Femorotibial and patellofemoral joint space narrowing is identified. Slight shallow depression of the lateral tibial plateau is noted that could potentially represent a subtle lateral tibial plateau depression fracture. However  given lack of significant joint fluid, this is believed less likely or may be chronic. IMPRESSION: 1. No acute fracture is identified especially in reference to the patella. There is minimal spurring off the upper pole of the patella which is slightly fragmented and could have been mistaken for patellar fracture. This appears stable relative to a prior study from 09/30/2006. 2. Subtle shallow depression of the lateral tibial plateaus is suggested on one view that could reflect old remote trauma given lack of significant joint fluid or is due to projection. CT or MRI may help for better assessment if clinically believed necessary. 3. Trace joint effusion. 4. Mild periarticular soft tissue edema. Electronically Signed   By: Ashley Royalty M.D.   On: 07/01/2018 21:56    Positive ROS: All other systems have been reviewed and were otherwise negative with the exception of those mentioned in the HPI and as above.  Physical Exam: General: Alert, no acute distress Cardiovascular: No pedal edema Respiratory: No cyanosis, no use of accessory musculature GI: No organomegaly, abdomen is soft and non-tender Skin: No lesions in the area of chief complaint Neurologic: Sensation intact distally Psychiatric: Patient is competent for consent with normal mood and affect Lymphatic: No axillary or cervical lymphadenopathy  MUSCULOSKELETAL: left knee with mild swelling, medial joint line tender, ligaments stable to varus and valgus, ROM 10 to 70 degrees, no erythema, warmth or edema Left heel with tenderness along the posterior aspect, skin is intact  Assessment: Advanced Knee osteoarthritis and medial compartment bone bruise Heel pain  Plan: She may weight bear as tolerated, however this is quite limited by pain. Recommend a heel cradle to prevent a pressure sore and a knee immobilizer. Will start physical therapy. She may follow-up with Dr. Sabra Heck in 2 to 4 weeks.    Lovell Sheehan, MD    07/03/2018 1:07  PM

## 2018-07-03 NOTE — Progress Notes (Signed)
PT Cancellation Note  Patient Details Name: Paige Vasquez MRN: [id number] DOB: [birth year]   Cancelled Treatment:    Reason Eval/Treat Not Completed: Medical issues which prohibited therapy. PT spoke with nursing regarding ortho consult, as ortho consult not yet in [facility] but nursing said pt is awaiting ortho consult for L knee fx. Nursing stated pt's L knee very swollen today and pt has difficulty following commands at this time 2/2 hx of dementia. PT will re-attempt eval at a later date/time, once pt has been seen by ortho and POC has been determined.  Geoffry Paradise, PT,DPT 07/03/18 9:02 AM     [name] L 07/03/2018, 9:01 AM

## 2018-07-04 MED ORDER — CEFDINIR 125 MG/5ML PO SUSR
300.0000 mg | Freq: Two times a day (BID) | ORAL | Status: DC
  Administered 2018-07-04 (×2): 300 mg via ORAL
  Filled 2018-07-04 (×3): qty 15

## 2018-07-04 MED ORDER — CEFDINIR 300 MG PO CAPS
300.0000 mg | ORAL_CAPSULE | Freq: Two times a day (BID) | ORAL | Status: DC
  Filled 2018-07-04 (×2): qty 1

## 2018-07-04 NOTE — Clinical Social Work Note (Signed)
Clinical Social Work Assessment  Patient Details  Name: Paige Vasquez MRN: [id number] Date of Birth: March 05, 1927  Date of referral:  07/04/18               Reason for consult:  Facility Placement                Permission sought to share information with:  Chartered certified accountant granted to share information::  Yes, Verbal Permission Granted  Name::        Agency::  [facility] SNFs  Relationship::     Contact Information:     Housing/Transportation Living arrangements for the past 2 months:  [place] of Information:  Medical Team, Adult Children Patient Interpreter Needed:  None Criminal Activity/Legal Involvement Pertinent to Current Situation/Hospitalization:  No - Comment as needed Significant Relationships:  Adult Children, [employer], [employer] Lives with:  Adult Children Do you feel safe going back to the place where you live?  Yes Need for family participation in patient care:  No (Coment)  Care giving concerns:  PT recommendation for SNF   Social Worker assessment / plan:  The CSW met with the patient and her daughter at bedside to discuss discharge planning. The patient was lethargic and did not respond, much. The patient's daughter verbalized agreement with a SNF referral and named [facility] as her preference. The CSW advised that [facility] had made a bed offer, which the patient and her daughter accepted. [facility] has begun the [place], and the patient will most likely discharge tomorrow.  The CSW will continue to follow.  Employment status:  Retired Nurse, adult PT Recommendations:  [place] / Referral to community resources:  [place]  Patient/Family's Response to care:  The patient and her family thanked the CSW  Patient/Family's Understanding of and Emotional Response to Diagnosis, Current Treatment, and Prognosis:  The patient's daughter is  thankful for the SNF bed offer and is in agreement with the plan.  Emotional Assessment Appearance:  Appears stated age Attitude/Demeanor/Rapport:  Lethargic Affect (typically observed):  Stable Orientation:  Oriented to Self, Oriented to Situation, Oriented to Place Alcohol / Substance use:  Never Used Psych involvement (Current and /or in the community):  No (Comment)  Discharge Needs  Concerns to be addressed:  Discharge Planning Concerns, Care Coordination Readmission within the last 30 days:  No Current discharge risk:  Chronically ill Barriers to Discharge:  Continued Medical Work up   [employer], LCSW 07/04/2018, 2:49 PM

## 2018-07-04 NOTE — Progress Notes (Addendum)
[facility]                                                                                                                                                                                  Patient Demographics   Arshia Spellman, is a 84 y.o. female, DOB - 1927-12-11, WUX:[id number]  Admit date - 07/01/2018   Admitting Physician Lance Coon, MD  Outpatient Primary MD for the patient is Rusty Aus, MD   LOS - 0  Subjective: Continues to have some left knee pain   Review of Systems:   CONSTITUTIONAL: No documented fever. No fatigue, weakness. No weight gain, no weight loss.  EYES: No blurry or double vision.  ENT: No tinnitus. No postnasal drip. No redness of the oropharynx.  RESPIRATORY: No cough, no wheeze, no hemoptysis. No dyspnea.  CARDIOVASCULAR: No chest pain. No orthopnea. No palpitations. No syncope.  GASTROINTESTINAL: No nausea, no vomiting or diarrhea. No abdominal pain. No melena or hematochezia.  GENITOURINARY: No dysuria or hematuria.  ENDOCRINE: No polyuria or nocturia. No heat or cold intolerance.  HEMATOLOGY: No anemia. No bruising. No bleeding.  INTEGUMENTARY: No rashes. No lesions.  MUSCULOSKELETAL: No arthritis. No swelling. No gout.  Positive left knee pain NEUROLOGIC: No numbness, tingling, or ataxia. No seizure-type activity.  PSYCHIATRIC: No anxiety. No insomnia. No ADD.    Vitals:   Vitals:   07/03/18 1415 07/03/18 1449 07/03/18 2033 07/04/18 0425  BP: (!) 104/46 (!) 110/59 (!) 105/54 (!) 129/53  Pulse: 74 63 61 (!) 50  Resp: 18 18 16 16   Temp: 97.6 F (36.4 C)  98.5 F (36.9 C) 98 F (36.7 C)  TempSrc: Oral  Oral Oral  SpO2: 97% 96% 93% 94%  Weight:      Height:        Wt Readings from Last 3 Encounters:  07/01/18 59 kg (130 lb)     Intake/Output Summary (Last 24 hours) at 07/04/2018 1306 Last data filed at 07/04/2018 0500 Gross per 24 hour  Intake 200 ml  Output -  Net 200 ml    Physical Exam:    GENERAL: Pleasant-appearing in no apparent distress.  HEAD, EYES, EARS, NOSE AND THROAT: Atraumatic, normocephalic. Extraocular muscles are intact. Pupils equal and reactive to light. Sclerae anicteric. No conjunctival injection. No oro-pharyngeal erythema.  NECK: Supple. There is no jugular venous distention. No bruits, no lymphadenopathy, no thyromegaly.  HEART: Regular rate and rhythm,. No murmurs, no rubs, no clicks.  LUNGS: Clear to auscultation bilaterally. No rales or rhonchi. No wheezes.  ABDOMEN: Soft, flat, nontender, nondistended. Has good bowel sounds. No hepatosplenomegaly appreciated.  EXTREMITIES: Difficulty with moving left  leg with flexion and extension has left knee swelling NEUROLOGIC: The patient is alert, awake, and oriented x3 with no focal motor or sensory deficits appreciated bilaterally.  SKIN: Moist and warm with no rashes appreciated.  Psych: Not anxious, depressed LN: No inguinal LN enlargement    Antibiotics   Anti-infectives (From admission, onward)   Start     Dose/Rate Route Frequency Ordered Stop   07/04/18 1315  cefdinir (OMNICEF) capsule 300 mg     300 mg Oral Every 12 hours 07/04/18 1305     07/03/18 0430  cefTRIAXone (ROCEPHIN) 1 g in sodium chloride 0.9 % 100 mL IVPB  Status:  Discontinued     1 g 200 mL/hr over 30 Minutes Intravenous Every 24 hours 07/02/18 0619 07/04/18 1305   07/02/18 0430  cefTRIAXone (ROCEPHIN) 1 g in sodium chloride 0.9 % 100 mL IVPB     1 g 200 mL/hr over 30 Minutes Intravenous  Once 07/02/18 0415 07/02/18 0453      Medications   Scheduled Meds: . cefdinir  300 mg Oral Q12H  . enoxaparin (LOVENOX) injection  40 mg Subcutaneous Q24H  . meloxicam  7.5 mg Oral Daily  . pramipexole  0.25 mg Oral BID  . traMADol  100 mg Oral BID   Continuous Infusions:  PRN Meds:.acetaminophen **OR** acetaminophen, ALPRAZolam, ondansetron **OR** ondansetron (ZOFRAN) IV, traZODone   Data Review:   Micro Results Recent Results  (from the past 240 hour(s))  Urine Culture     Status: Abnormal (Preliminary result)   Collection Time: 07/01/18 10:47 PM  Result Value Ref Range Status   Specimen Description   Final    URINE, RANDOM Performed at [facility], [address] [place], [place] [address]    Special Requests   Final    NONE Performed at [facility], [place], [place] [address]    Culture 40,000 COLONIES/mL GRAM NEGATIVE RODS (A)  Final   Report Status PENDING  Incomplete    Radiology Reports Mr Knee Left Wo Contrast  Result Date: 07/02/2018 CLINICAL DATA:  Multiple falls.  Lower extremity weakness. EXAM: MRI OF THE LEFT KNEE WITHOUT CONTRAST TECHNIQUE: Multiplanar, multisequence MR imaging of the knee was performed. No intravenous contrast was administered. COMPARISON:  None. FINDINGS: MENISCI Medial meniscus: Oblique tear of the posterior horn of the medial meniscus extending to the inferior articular surface. Radial tear of the posterior horn of the medial meniscus at the meniscal root. Lateral meniscus: Radial tear of the free edge of the anterior and posterior horns of the lateral meniscus. Radial tear of the body of the lateral meniscus. LIGAMENTS Cruciates: Intact ACL and PCL. ACL is increased in signal and expanded consistent with mucinous degeneration. Collaterals: Medial collateral ligament is intact. Lateral collateral ligament complex is intact. CARTILAGE Patellofemoral: Partial-thickness cartilage loss of the patellofemoral compartment. Medial: Extensive full-thickness cartilage loss of the medial femoral condyle and medial tibial plateau with subchondral reactive marrow edema. Lateral: High-grade partial-thickness cartilage loss with areas of full-thickness cartilage loss of the lateral femoral condyle and lateral tibial plateau. Joint: Moderate-sized joint effusion. Edema in Hoffa's fat. No plical thickening. Popliteal Fossa:  No Baker's cyst.  Intact popliteus  tendon. Extensor Mechanism: Intact quadriceps tendon. Intact patellar tendon. Intact medial patellar retinaculum. Intact lateral patellar retinaculum. Intact MPFL. Bones: Subchondral linear low signal in the medial femoral condyle weight-bearing surface with severe surrounding marrow edema most consistent with a nondisplaced, nondepressed subchondral insufficiency fracture. No other fracture or dislocation. Other:  No fluid collection or hematoma. Muscles are normal. No muscle atrophy. IMPRESSION: 1. Oblique tear of the posterior horn of the medial meniscus extending to the inferior articular surface. Radial tear of the posterior horn of the medial meniscus at the meniscal root. 2. Radial tear of the free edge of the anterior and posterior horns of the lateral meniscus. Radial tear of the body of the lateral meniscus. 3. Nondepressed, nondisplaced subchondral insufficiency fracture of the weight-bearing surface of the medial femoral condyle with surrounding marrow edema. 4. Tricompartmental cartilage abnormalities most severe in the medial and lateral femorotibial compartments consistent with advanced osteoarthritis. Electronically Signed   By: Kathreen Devoid   On: 07/02/2018 14:44   US Venous Img Lower Unilateral Left  Result Date: 07/01/2018 CLINICAL DATA:  Pain and swelling of the left lower extremity. EXAM: LEFT LOWER EXTREMITY VENOUS DOPPLER ULTRASOUND TECHNIQUE: Gray-scale sonography with graded compression, as well as color Doppler and duplex ultrasound were performed to evaluate the lower extremity deep venous systems from the level of the common femoral vein and including the common femoral, femoral, profunda femoral, popliteal and calf veins including the posterior tibial, peroneal and gastrocnemius veins when visible. The superficial great saphenous vein was also interrogated. Spectral Doppler was utilized to evaluate flow at rest and with distal augmentation maneuvers in the common femoral, femoral and  popliteal veins. COMPARISON:  None. FINDINGS: Contralateral Common Femoral Vein: Respiratory phasicity is normal and symmetric with the symptomatic side. No evidence of thrombus. Normal compressibility. Common Femoral Vein: No evidence of thrombus. Normal compressibility, respiratory phasicity and response to augmentation. Saphenofemoral Junction: No evidence of thrombus. Normal compressibility and flow on color Doppler imaging. Profunda Femoral Vein: No evidence of thrombus. Normal compressibility and flow on color Doppler imaging. Femoral Vein: No evidence of thrombus. Normal compressibility, respiratory phasicity and response to augmentation. Popliteal Vein: No evidence of thrombus. Normal compressibility, respiratory phasicity and response to augmentation. Calf Veins: No evidence of thrombus. Normal compressibility and flow on color Doppler imaging. Superficial Great Saphenous Vein: No evidence of thrombus. Normal compressibility. Venous Reflux:  None. Other Findings:  None. IMPRESSION: No evidence of deep venous thrombosis. Electronically Signed   By: Fidela Salisbury M.D.   On: 07/01/2018 23:00   Dg Knee Complete 4 Views Left  Result Date: 07/01/2018 CLINICAL DATA:  Left knee pain, daughter states patient had a broken knee cap on imaging performed elsewhere though is unsure how that could have happened. EXAM: LEFT KNEE - COMPLETE 4+ VIEW COMPARISON:  09/30/2006 FINDINGS: Mild soft tissue swelling and edema is seen about the left knee. Trace joint effusion. Femorotibial and patellofemoral joint space narrowing is identified. Slight shallow depression of the lateral tibial plateau is noted that could potentially represent a subtle lateral tibial plateau depression fracture. However given lack of significant joint fluid, this is believed less likely or may be chronic. IMPRESSION: 1. No acute fracture is identified especially in reference to the patella. There is minimal spurring off the upper pole of the  patella which is slightly fragmented and could have been mistaken for patellar fracture. This appears stable relative to a prior study from 09/30/2006. 2. Subtle shallow depression of the lateral tibial plateaus is suggested on one view that could reflect old remote trauma given lack of significant joint fluid or is due to projection. CT or MRI may help for better assessment if clinically believed necessary. 3. Trace joint effusion. 4. Mild periarticular soft tissue edema. Electronically Signed   By: Meredith Leeds.D.  On: 07/01/2018 21:56     CBC Recent Labs  Lab 07/02/18 0032  WBC 8.4  HGB 13.4  HCT 38.8  PLT 215  MCV 84.8  MCH 29.2  MCHC 34.5  RDW 13.1  LYMPHSABS 1.1  MONOABS 0.6  EOSABS 0.0  BASOSABS 0.0    Chemistries  Recent Labs  Lab 07/02/18 0032  NA 139  K 3.6  CL 105  CO2 27  GLUCOSE 94  BUN 22  CREATININE 0.79  CALCIUM 8.9   ------------------------------------------------------------------------------------------------------------------ estimated creatinine clearance is 40.2 mL/min (by C-G formula based on SCr of 0.79 mg/dL). ------------------------------------------------------------------------------------------------------------------ No results for input(s): HGBA1C in the last 72 hours. ------------------------------------------------------------------------------------------------------------------ No results for input(s): CHOL, HDL, LDLCALC, TRIG, CHOLHDL, LDLDIRECT in the last 72 hours. ------------------------------------------------------------------------------------------------------------------ No results for input(s): TSH, T4TOTAL, T3FREE, THYROIDAB in the last 72 hours.  Invalid input(s): FREET3 ------------------------------------------------------------------------------------------------------------------ No results for input(s): VITAMINB12, FOLATE, FERRITIN, TIBC, IRON, RETICCTPCT in the last 72 hours.  Coagulation profile No results for  input(s): INR, PROTIME in the last 168 hours.  No results for input(s): DDIMER in the last 72 hours.  Cardiac Enzymes Recent Labs  Lab 07/02/18 0032  TROPONINI <0.03   ------------------------------------------------------------------------------------------------------------------ Invalid input(s): POCBNP    Assessment & Plan  [name] admitted with recurrent falls  1.  Left knee pain Noted to have effusion as well as tear of the ligaments in the knee Appreciate ortho input  2.  UTI change to oral omnicef, pt urine with <40,000 ecoli  3.  Recurrent falls PT evaluation once MRI results back  4.  Hypertension continue home medication  5.  GERD continue PPI  6.  Restless legs syndrome continue home medication      Code Status Orders  (From admission, onward)        Start     Ordered   07/02/18 1200  Do not attempt resuscitation (DNR)  Continuous    Question Answer Comment  In the event of cardiac or respiratory ARREST Do not call a "code blue"   In the event of cardiac or respiratory ARREST Do not perform Intubation, CPR, defibrillation or ACLS   In the event of cardiac or respiratory ARREST Use medication by any route, position, wound care, and other measures to relive pain and suffering. May use oxygen, suction and manual treatment of airway obstruction as needed for comfort.      07/02/18 1159    Code Status History    Date Active Date Inactive Code Status Order ID Comments User Context   07/02/2018 0619 07/02/2018 1159 Full Code [id number]  Lance Coon, MD Inpatient           Consults none  DVT Prophylaxis  Lovenox    Lab Results  Component Value Date   PLT 215 07/02/2018     Time Spent in minutes   80min Greater than 50% of time spent in care coordination and counseling patient regarding the condition and plan of care.   Dustin Flock M.D on 07/04/2018 at 1:06 PM  Between 7am to 6pm - Pager -  [phone]  After 6pm go to [email or web address] - Proofreader  [employer]   Office  [phone]

## 2018-07-05 LAB — TROPONIN I: Troponin I: 0.05 ng/mL (ref <0.03)

## 2018-07-05 LAB — URINE CULTURE: Culture: 40000 — AB

## 2018-07-05 MED ORDER — CIPROFLOXACIN HCL 500 MG PO TABS
500.0000 mg | ORAL_TABLET | Freq: Two times a day (BID) | ORAL | Status: DC
  Administered 2018-07-05: 500 mg via ORAL
  Filled 2018-07-05: qty 1

## 2018-07-05 MED ORDER — TRAMADOL HCL 50 MG PO TABS: 50.0000 mg | ORAL_TABLET | Freq: Four times a day (QID) | ORAL | 0 refills | Status: DC | PRN

## 2018-07-05 MED ORDER — ADENOSINE 6 MG/2ML IV SOLN
6.0000 mg | Freq: Once | INTRAVENOUS | Status: AC
  Administered 2018-07-05: 6 mg via INTRAVENOUS
  Filled 2018-07-05 (×2): qty 2

## 2018-07-05 MED ORDER — ALPRAZOLAM 0.5 MG PO TABS
0.5000 mg | ORAL_TABLET | Freq: Once | ORAL | Status: AC
  Administered 2018-07-05: 0.5 mg via ORAL
  Filled 2018-07-05: qty 1

## 2018-07-05 MED ORDER — ALPRAZOLAM 0.25 MG PO TABS: 0.2500 mg | ORAL_TABLET | Freq: Two times a day (BID) | ORAL | 0 refills | Status: DC | PRN

## 2018-07-05 MED ORDER — CIPROFLOXACIN HCL 500 MG PO TABS: 500.0000 mg | ORAL_TABLET | Freq: Two times a day (BID) | ORAL | 0 refills | Status: AC

## 2018-07-05 MED ORDER — METOPROLOL TARTRATE 25 MG PO TABS: 12.5000 mg | ORAL_TABLET | Freq: Two times a day (BID) | ORAL | 11 refills | Status: DC | PRN

## 2018-07-05 MED ORDER — ACETAMINOPHEN 325 MG PO TABS: 650.0000 mg | ORAL_TABLET | Freq: Four times a day (QID) | ORAL | Status: DC | PRN

## 2018-07-05 NOTE — Progress Notes (Signed)
Physical Therapy Treatment Patient Details Name: Paige Vasquez MRN: [id number] DOB: 06-10-1927 Today's Date: 07/05/2018    History of Present Illness Pt is a pleasant 84y/o female presenting to ED with hx of falls and LE weakness. Pt found to have a UTI and medial/lateral meniscus tears and insufficiency fx of medial femoral condyle of LLE. Pt must wear L knee immobilizer per ortho and watch L heel for deveolopment of pressure ulcer. PMH significant for the following: HTN, RLS, possible dementia per MD notes.     PT Comments    Patient performs supine to sit and sit to supine with min assist, with KI .  She performs sit to stand with min assist and ambulates with RW and 10 feet x 2 with shuffling gait and very slow antgalgic gait. Patient performs therapeutic exercise BLE with assist for LLE. Patient will continue to benefit from skilled PT to improve mobility.   Follow Up Recommendations  SNF     Equipment Recommendations  None recommended by PT    Recommendations for Other Services       Precautions / Restrictions Precautions Precautions: Fall Required Braces or Orthoses: Knee Immobilizer - Left Knee Immobilizer - Left: On at all times Restrictions Weight Bearing Restrictions: No LLE Weight Bearing: Weight bearing as tolerated Other Position/Activity Restrictions: keep heels elevated and off bed.    Mobility  Bed Mobility Overal bed mobility: Needs Assistance Bed Mobility: Sit to Supine;Supine to Sit     Supine to sit: Min assist Sit to supine: Min assist   General bed mobility comments: Min A to guide LLE in/off EOB. Cues for hand placement and weight shifting to scoot towards EOB.  Transfers Overall transfer level: Needs assistance Equipment used: Rolling walker (2 wheeled) Transfers: Sit to/from Stand Sit to Stand: Min assist         General transfer comment: Min A with RW. Cues for hand placement and technique.   Ambulation/Gait Ambulation/Gait  assistance: Min assist   Assistive device: Rolling walker (2 wheeled) Gait Pattern/deviations: Shuffle;Decreased weight shift to left     General Gait Details: Pt unable to step with RLE 2/2 limited ability to place weight on LLE 2/2 pain. Pt could swing LLE forward/backwards, and would slid RLE forward/backward. Pt amb. 3' forward/3' backwards before requiring rest break 2/2 fatigue and pain.    Stairs             Wheelchair Mobility    Modified Rankin (Stroke Patients Only)       Balance Overall balance assessment: Needs assistance Sitting-balance support: Bilateral upper extremity supported;Feet supported Sitting balance-Leahy Scale: Good     Standing balance support: Bilateral upper extremity supported Standing balance-Leahy Scale: Poor Standing balance comment: BUE support on RW with limited weight bearing on LLE 2/2 pain                            Cognition Arousal/Alertness: Awake/alert Behavior During Therapy: WFL for tasks assessed/performed Overall Cognitive Status: History of cognitive impairments - at baseline                                        Exercises General Exercises - Lower Extremity Ankle Circles/Pumps: AROM Quad Sets: Strengthening;Left;Right;10 reps;Supine Gluteal Sets: AROM;Both;10 reps;Supine Short Arc Quad: AAROM;15 reps;Supine Heel Slides: AROM;Right;10 reps;Supine Hip ABduction/ADduction: AAROM;10 reps;Supine Straight Leg Raises: AROM;10 reps;Supine  General Comments        Pertinent Vitals/Pain Faces Pain Scale: Hurts little more Pain Location: L knee during standing, but no pain at rest in supine Pain Descriptors / Indicators: Guarding;Grimacing    Home Living                      Prior Function            PT Goals (current goals can now be found in the care plan section) Acute Rehab PT Goals Patient Stated Goal: To get better PT Goal Formulation: With patient Time For Goal  Achievement: 07/17/18 Potential to Achieve Goals: Good    Frequency    7X/week      PT Plan Current plan remains appropriate    Co-evaluation              AM-PAC PT "6 Clicks" Daily Activity  Outcome Measure  Difficulty turning over in bed (including adjusting bedclothes, sheets and blankets)?: A Little Difficulty moving from lying on back to sitting on the side of the bed? : A Little Difficulty sitting down on and standing up from a chair with arms (e.g., wheelchair, bedside commode, etc,.)?: A Little Help needed moving to and from a bed to chair (including a wheelchair)?: A Little Help needed walking in hospital room?: A Little Help needed climbing 3-5 steps with a railing? : A Lot 6 Click Score: 17    End of Session Equipment Utilized During Treatment: Gait belt Activity Tolerance: Patient limited by pain;Patient tolerated treatment well Patient left: in bed;with call bell/phone within reach;with bed alarm set;with family/visitor present Nurse Communication: Mobility status;Other (comment) PT Visit Diagnosis: Unsteadiness on feet (R26.81);Other abnormalities of gait and mobility (R26.89);Muscle weakness (generalized) (M62.81);History of falling (Z91.81);Pain Pain - Right/Left: Left Pain - part of body: Knee     Time:  -     Charges:                       G Codes:  Functional Assessment Tool Used: AM-PAC 6 Clicks Basic Mobility Functional Limitation: Mobility: Walking and moving around Mobility: Walking and Moving Around Current Status (Y6060): 0 percent impaired, limited or restricted      Alanson Puls , PT DPT 07/05/2018, 1:56 PM

## 2018-07-05 NOTE — Discharge Summary (Addendum)
[facility] [place], 84 y.o., DOB 1927/10/23, MRN [id number]. Admission date: 07/01/2018 Discharge Date 07/05/2018 Primary MD Rusty Aus, MD Admitting Physician Lance Coon, MD  Admission Diagnosis  Left leg pain [M79.605] Left leg swelling [M79.89]  Discharge Diagnosis   Principal Problem: Left knee pain due to ligament tear as well as effusion knee brace per Ortho Cystitis urine culture shows less than 40,000 Pseudomonas Recurrent falls Hypertension GERD Restless leg syndrome Anxiety  Hospital Course [name] admitted with recurrent falls.  Patient was evaluated in the ER and was noted to have abnormal UA therefore she was admitted.  She continued to complain of significant left knee pain.  Therefore had an MRI which showed some ligament tear as well as knee effusion.  She was seen by orthopedic who recommended left knee brace.  Patient is also very weak and in need of rehab which is being arranged.   Cipro 500 two  times a day for 4 days   Patient to have left knee brace in place        Consults orthopedics Significant Tests:  See full reports for all details     Mr Knee Left Wo Contrast  Result Date: 07/02/2018 CLINICAL DATA:  Multiple falls.  Lower extremity weakness. EXAM: MRI OF THE LEFT KNEE WITHOUT CONTRAST TECHNIQUE: Multiplanar, multisequence MR imaging of the knee was performed. No intravenous contrast was administered. COMPARISON:  None. FINDINGS: MENISCI Medial meniscus: Oblique tear of the posterior horn of the medial meniscus extending to the inferior articular surface. Radial tear of the posterior horn of the medial meniscus at the meniscal root. Lateral meniscus: Radial tear of the free edge of the anterior and posterior horns of the lateral meniscus. Radial tear of the body of the lateral meniscus. LIGAMENTS Cruciates: Intact ACL and PCL. ACL is increased in signal and expanded  consistent with mucinous degeneration. Collaterals: Medial collateral ligament is intact. Lateral collateral ligament complex is intact. CARTILAGE Patellofemoral: Partial-thickness cartilage loss of the patellofemoral compartment. Medial: Extensive full-thickness cartilage loss of the medial femoral condyle and medial tibial plateau with subchondral reactive marrow edema. Lateral: High-grade partial-thickness cartilage loss with areas of full-thickness cartilage loss of the lateral femoral condyle and lateral tibial plateau. Joint: Moderate-sized joint effusion. Edema in Hoffa's fat. No plical thickening. Popliteal Fossa:  No Baker's cyst.  Intact popliteus tendon. Extensor Mechanism: Intact quadriceps tendon. Intact patellar tendon. Intact medial patellar retinaculum. Intact lateral patellar retinaculum. Intact MPFL. Bones: Subchondral linear low signal in the medial femoral condyle weight-bearing surface with severe surrounding marrow edema most consistent with a nondisplaced, nondepressed subchondral insufficiency fracture. No other fracture or dislocation. Other: No fluid collection or hematoma. Muscles are normal. No muscle atrophy. IMPRESSION: 1. Oblique tear of the posterior horn of the medial meniscus extending to the inferior articular surface. Radial tear of the posterior horn of the medial meniscus at the meniscal root. 2. Radial tear of the free edge of the anterior and posterior horns of the lateral meniscus. Radial tear of the body of the lateral meniscus. 3. Nondepressed, nondisplaced subchondral insufficiency fracture of the weight-bearing surface of the medial femoral condyle with surrounding marrow edema. 4. Tricompartmental cartilage abnormalities most severe in the medial and lateral femorotibial compartments consistent with advanced osteoarthritis. Electronically Signed   By: Kathreen Devoid   On: 07/02/2018 14:44   US Venous Img Lower Unilateral Left  Result Date: 07/01/2018 CLINICAL DATA:   Pain  and swelling of the left lower extremity. EXAM: LEFT LOWER EXTREMITY VENOUS DOPPLER ULTRASOUND TECHNIQUE: Gray-scale sonography with graded compression, as well as color Doppler and duplex ultrasound were performed to evaluate the lower extremity deep venous systems from the level of the common femoral vein and including the common femoral, femoral, profunda femoral, popliteal and calf veins including the posterior tibial, peroneal and gastrocnemius veins when visible. The superficial great saphenous vein was also interrogated. Spectral Doppler was utilized to evaluate flow at rest and with distal augmentation maneuvers in the common femoral, femoral and popliteal veins. COMPARISON:  None. FINDINGS: Contralateral Common Femoral Vein: Respiratory phasicity is normal and symmetric with the symptomatic side. No evidence of thrombus. Normal compressibility. Common Femoral Vein: No evidence of thrombus. Normal compressibility, respiratory phasicity and response to augmentation. Saphenofemoral Junction: No evidence of thrombus. Normal compressibility and flow on color Doppler imaging. Profunda Femoral Vein: No evidence of thrombus. Normal compressibility and flow on color Doppler imaging. Femoral Vein: No evidence of thrombus. Normal compressibility, respiratory phasicity and response to augmentation. Popliteal Vein: No evidence of thrombus. Normal compressibility, respiratory phasicity and response to augmentation. Calf Veins: No evidence of thrombus. Normal compressibility and flow on color Doppler imaging. Superficial Great Saphenous Vein: No evidence of thrombus. Normal compressibility. Venous Reflux:  None. Other Findings:  None. IMPRESSION: No evidence of deep venous thrombosis. Electronically Signed   By: Fidela Salisbury M.D.   On: 07/01/2018 23:00   Dg Knee Complete 4 Views Left  Result Date: 07/01/2018 CLINICAL DATA:  Left knee pain, daughter states patient had a broken knee cap on imaging performed  elsewhere though is unsure how that could have happened. EXAM: LEFT KNEE - COMPLETE 4+ VIEW COMPARISON:  09/30/2006 FINDINGS: Mild soft tissue swelling and edema is seen about the left knee. Trace joint effusion. Femorotibial and patellofemoral joint space narrowing is identified. Slight shallow depression of the lateral tibial plateau is noted that could potentially represent a subtle lateral tibial plateau depression fracture. However given lack of significant joint fluid, this is believed less likely or may be chronic. IMPRESSION: 1. No acute fracture is identified especially in reference to the patella. There is minimal spurring off the upper pole of the patella which is slightly fragmented and could have been mistaken for patellar fracture. This appears stable relative to a prior study from 09/30/2006. 2. Subtle shallow depression of the lateral tibial plateaus is suggested on one view that could reflect old remote trauma given lack of significant joint fluid or is due to projection. CT or MRI may help for better assessment if clinically believed necessary. 3. Trace joint effusion. 4. Mild periarticular soft tissue edema. Electronically Signed   By: Ashley Royalty M.D.   On: 07/01/2018 21:56       Today   Subjective:   Paige Vasquez patient denies any complaints confused at baseline  Objective:   Blood pressure (!) 147/61, pulse (!) 51, temperature 98.2 F (36.8 C), temperature source Oral, resp. rate 18, height 5\' 3"  (1.6 m), weight 59 kg (130 lb), SpO2 96 %.  .  Intake/Output Summary (Last 24 hours) at 07/05/2018 1110 Last data filed at 07/05/2018 0957 Gross per 24 hour  Intake 240 ml  Output -  Net 240 ml    Exam VITAL SIGNS: Blood pressure (!) 147/61, pulse (!) 51, temperature 98.2 F (36.8 C), temperature source Oral, resp. rate 18, height 5\' 3"  (1.6 m), weight 59 kg (130 lb), SpO2 96 %.  GENERAL:  84 y.o.-year-old  patient lying in the bed with no acute distress.  EYES: Pupils equal,  round, reactive to light and accommodation. No scleral icterus. Extraocular muscles intact.  HEENT: Head atraumatic, normocephalic. Oropharynx and nasopharynx clear.  NECK:  Supple, no jugular venous distention. No thyroid enlargement, no tenderness.  LUNGS: Normal breath sounds bilaterally, no wheezing, rales,rhonchi or crepitation. No use of accessory muscles of respiration.  CARDIOVASCULAR: S1, S2 normal. No murmurs, rubs, or gallops.  ABDOMEN: Soft, nontender, nondistended. Bowel sounds present. No organomegaly or mass.  EXTREMITIES: No pedal edema, cyanosis, or clubbing.  NEUROLOGIC: Cranial nerves II through XII are intact. Muscle strength 5/5 in all extremities. Sensation intact. Gait not checked.  PSYCHIATRIC: The patient is alert and oriented x 3.  SKIN: No obvious rash, lesion, or ulcer.   Data Review     CBC w Diff:  Lab Results  Component Value Date   WBC 8.4 07/02/2018   HGB 13.4 07/02/2018   HGB 13.3 08/12/2014   HCT 38.8 07/02/2018   HCT 39.2 08/12/2014   PLT 215 07/02/2018   PLT 223 08/12/2014   LYMPHOPCT 13 07/02/2018   MONOPCT 7 07/02/2018   EOSPCT 0 07/02/2018   BASOPCT 1 07/02/2018   CMP:  Lab Results  Component Value Date   NA 139 07/02/2018   NA 140 08/12/2014   K 3.6 07/02/2018   K 3.7 08/12/2014   CL 105 07/02/2018   CL 104 08/12/2014   CO2 27 07/02/2018   CO2 30 08/12/2014   BUN 22 07/02/2018   BUN 22 (H) 08/12/2014   CREATININE 0.79 07/02/2018   CREATININE 0.87 08/12/2014  .  Micro Results Recent Results (from the past 240 hour(s))  Urine Culture     Status: Abnormal   Collection Time: 07/01/18 10:47 PM  Result Value Ref Range Status   Specimen Description   Final    URINE, RANDOM Performed at [facility], [address] [place], [place] [address]    Special Requests   Final    NONE Performed at [facility], [place], [place] [address]    Culture 40,000 COLONIES/mL PSEUDOMONAS AERUGINOSA (A)   Final   Report Status 07/05/2018 FINAL  Final   Organism ID, Bacteria PSEUDOMONAS AERUGINOSA (A)  Final      Susceptibility   Pseudomonas aeruginosa - MIC*    CEFTAZIDIME 4 SENSITIVE Sensitive     CIPROFLOXACIN <=0.25 SENSITIVE Sensitive     GENTAMICIN <=1 SENSITIVE Sensitive     IMIPENEM 2 SENSITIVE Sensitive     PIP/TAZO 8 SENSITIVE Sensitive     CEFEPIME 2 SENSITIVE Sensitive     * 40,000 COLONIES/mL PSEUDOMONAS AERUGINOSA        Code Status Orders  (From admission, onward)        Start     Ordered   07/02/18 1200  Do not attempt resuscitation (DNR)  Continuous    Question Answer Comment  In the event of cardiac or respiratory ARREST Do not call a "code blue"   In the event of cardiac or respiratory ARREST Do not perform Intubation, CPR, defibrillation or ACLS   In the event of cardiac or respiratory ARREST Use medication by any route, position, wound care, and other measures to relive pain and suffering. May use oxygen, suction and manual treatment of airway obstruction as needed for comfort.      07/02/18 1159    Code Status History    Date Active Date Inactive Code Status Order ID  Comments User Context   07/02/2018 0619 07/02/2018 1159 Full Code [id number]  Lance Coon, MD Inpatient           Contact information for follow-up providers    Rusty Aus, MD. Go on 07/07/2018.   Specialty:  Internal Medicine Why:  @10 :15AM Contact information: [place] [address] [phone]        Earnestine Leys, MD On 07/20/2018.   Specialty:  Orthopedic Surgery Why:  @9 :30AM  knee pain and swelling Contact information: [place] [address] [phone]            Contact information for after-discharge care    Destination    [facility] .   Service:  Skilled Nursing Contact information: [address] [place] [phone]                   Discharge Medications   Allergies as of 07/05/2018   No Known Allergies     Medication List    TAKE these medications   acetaminophen 325 MG tablet Commonly known as:  TYLENOL Take 2 tablets (650 mg total) by mouth every 6 (six) hours as needed for mild pain (or Fever >/= 101).   ALPRAZolam 0.25 MG tablet Commonly known as:  XANAX Take 1 tablet (0.25 mg total) by mouth 2 (two) times daily as needed for anxiety. What changed:  when to take this   ciprofloxacin 500 MG tablet Commonly known as:  CIPRO Take 1 tablet (500 mg total) by mouth 2 (two) times daily for 4 days.   meloxicam 7.5 MG tablet Commonly known as:  MOBIC Take 7.5 mg by mouth daily.   metoprolol tartrate 25 MG tablet Commonly known as:  LOPRESSOR Take 0.5 tablets (12.5 mg total) by mouth 2 (two) times daily as needed. Prn hr >110   multivitamin-lutein Caps capsule Take 1 capsule by mouth daily.   pramipexole 0.25 MG tablet Commonly known as:  MIRAPEX Take 0.25 mg by mouth 2 (two) times daily.   traMADol 50 MG tablet Commonly known as:  ULTRAM Take 1 tablet (50 mg total) by mouth every 6 (six) hours as needed. What changed:    when to take this  reasons to take this   VITAMIN D-1000 MAX ST 1000 units tablet Generic drug:  Cholecalciferol Take 1,000 Units by mouth daily.          Total Time in preparing paper work, data evaluation and todays exam - 2 minutes  Dustin Flock M.D on 07/05/2018 at [place]  [phone]

## 2018-07-05 NOTE — Progress Notes (Addendum)
I called Dr. Posey Pronto with a critical troponin of 0.05. He stated that is fine she can still go to [facility] today.

## 2018-07-05 NOTE — Progress Notes (Signed)
Alerted Dr. Posey Pronto of pt's HR dropping to the 30s. I will continue to monitor.

## 2018-07-05 NOTE — Progress Notes (Signed)
Hand off was called to Sabrina at [facility]. Pt and daughter are aware of her going there for rehab to room 502. Pt's vitals were taken and she was transported by EMS to [facility] while daughter followed behind her. Pt was alert and oriented and understood the instructions.

## 2018-07-05 NOTE — Clinical Social Work Note (Signed)
Patient's daughter has decided to proceed with discharge. [facility] has obtained the insurance authorization. Shela Leff MSW,LCSW [phone]

## 2018-07-05 NOTE — Progress Notes (Signed)
Pt's family stated that they did not feel safe taking pt to [facility] so I alerted Dr. Posey Pronto and he stated that he will call them to let them know that she is healthy enough to go home.

## 2018-07-05 NOTE — Progress Notes (Addendum)
Pt having tachy HR contacted Dr. Posey Pronto he ordered EKG and xanax. HR was still tachy so he ordered Adenosine therefore Brandy came up to administer. {Pt's HR came down to 80s. I will continue to monitor.

## 2018-07-20 DIAGNOSIS — S8002XA Contusion of left knee, initial encounter: Secondary | ICD-10-CM | POA: Insufficient documentation

## 2018-09-03 DIAGNOSIS — Z8781 Personal history of (healed) traumatic fracture: Secondary | ICD-10-CM | POA: Insufficient documentation

## 2019-01-22 ENCOUNTER — Emergency Department: Payer: Medicare Other

## 2019-01-22 ENCOUNTER — Inpatient Hospital Stay
Admission: EM | Admit: 2019-01-22 | Discharge: 2019-01-28 | DRG: 309 | Disposition: A | Discharge status: Skilled Nursing Facility | Payer: Medicare Other | Attending: Family Medicine | Admitting: Family Medicine

## 2019-01-22 ENCOUNTER — Encounter: Payer: Self-pay | Admitting: Emergency Medicine

## 2019-01-22 ENCOUNTER — Other Ambulatory Visit: Payer: Self-pay

## 2019-01-22 DIAGNOSIS — G309 Alzheimer's disease, unspecified: Secondary | ICD-10-CM | POA: Diagnosis present

## 2019-01-22 DIAGNOSIS — G2581 Restless legs syndrome: Secondary | ICD-10-CM | POA: Diagnosis present

## 2019-01-22 DIAGNOSIS — Z825 Family history of asthma and other chronic lower respiratory diseases: Secondary | ICD-10-CM

## 2019-01-22 DIAGNOSIS — R0789 Other chest pain: Secondary | ICD-10-CM | POA: Diagnosis not present

## 2019-01-22 DIAGNOSIS — F0281 Dementia in other diseases classified elsewhere with behavioral disturbance: Secondary | ICD-10-CM | POA: Diagnosis present

## 2019-01-22 DIAGNOSIS — T447X5A Adverse effect of beta-adrenoreceptor antagonists, initial encounter: Secondary | ICD-10-CM | POA: Diagnosis present

## 2019-01-22 DIAGNOSIS — Z79891 Long term (current) use of opiate analgesic: Secondary | ICD-10-CM | POA: Diagnosis not present

## 2019-01-22 DIAGNOSIS — Z800 Family history of malignant neoplasm of digestive organs: Secondary | ICD-10-CM | POA: Diagnosis not present

## 2019-01-22 DIAGNOSIS — Z79899 Other long term (current) drug therapy: Secondary | ICD-10-CM | POA: Diagnosis not present

## 2019-01-22 DIAGNOSIS — N390 Urinary tract infection, site not specified: Secondary | ICD-10-CM

## 2019-01-22 DIAGNOSIS — I119 Hypertensive heart disease without heart failure: Secondary | ICD-10-CM | POA: Diagnosis present

## 2019-01-22 DIAGNOSIS — Z66 Do not resuscitate: Secondary | ICD-10-CM | POA: Diagnosis present

## 2019-01-22 DIAGNOSIS — Z90710 Acquired absence of both cervix and uterus: Secondary | ICD-10-CM

## 2019-01-22 DIAGNOSIS — R001 Bradycardia, unspecified: Secondary | ICD-10-CM | POA: Diagnosis present

## 2019-01-22 DIAGNOSIS — R4182 Altered mental status, unspecified: Secondary | ICD-10-CM

## 2019-01-22 DIAGNOSIS — K219 Gastro-esophageal reflux disease without esophagitis: Secondary | ICD-10-CM | POA: Diagnosis present

## 2019-01-22 DIAGNOSIS — R52 Pain, unspecified: Secondary | ICD-10-CM

## 2019-01-22 DIAGNOSIS — Z789 Other specified health status: Secondary | ICD-10-CM

## 2019-01-22 LAB — URINALYSIS, COMPLETE (UACMP) WITH MICROSCOPIC
Bacteria, UA: NONE SEEN
Bilirubin Urine: NEGATIVE
GLUCOSE, UA: NEGATIVE mg/dL
Hgb urine dipstick: NEGATIVE
Ketones, ur: NEGATIVE mg/dL
NITRITE: NEGATIVE
PROTEIN: NEGATIVE mg/dL
Specific Gravity, Urine: 1.008 (ref 1.005–1.030)
pH: 6 (ref 5.0–8.0)

## 2019-01-22 LAB — CBC
HCT: 43 % (ref 36.0–46.0)
Hemoglobin: 14.2 g/dL (ref 12.0–15.0)
MCH: 28.6 pg (ref 26.0–34.0)
MCHC: 33 g/dL (ref 30.0–36.0)
MCV: 86.7 fL (ref 80.0–100.0)
NRBC: 0 % (ref 0.0–0.2)
PLATELETS: 227 10*3/uL (ref 150–400)
RBC: 4.96 MIL/uL (ref 3.87–5.11)
RDW: 12.7 % (ref 11.5–15.5)
WBC: 9.7 10*3/uL (ref 4.0–10.5)

## 2019-01-22 LAB — COMPREHENSIVE METABOLIC PANEL
ALK PHOS: 68 U/L (ref 38–126)
ALT: 20 U/L (ref 0–44)
AST: 26 U/L (ref 15–41)
Albumin: 3.9 g/dL (ref 3.5–5.0)
Anion gap: 6 (ref 5–15)
BILIRUBIN TOTAL: 0.6 mg/dL (ref 0.3–1.2)
BUN: 22 mg/dL (ref 8–23)
CALCIUM: 9 mg/dL (ref 8.9–10.3)
CO2: 28 mmol/L (ref 22–32)
CREATININE: 0.81 mg/dL (ref 0.44–1.00)
Chloride: 102 mmol/L (ref 98–111)
GFR calc non Af Amer: >60 mL/min (ref >60)
Glucose, Bld: 102 mg/dL — ABNORMAL HIGH (ref 70–99)
Potassium: 3.7 mmol/L (ref 3.5–5.1)
Sodium: 136 mmol/L (ref 135–145)
TOTAL PROTEIN: 7.1 g/dL (ref 6.5–8.1)

## 2019-01-22 LAB — PHOSPHORUS: Phosphorus: 2.7 mg/dL (ref 2.5–4.6)

## 2019-01-22 LAB — TROPONIN I: Troponin I: <0.03 ng/mL (ref <0.03)

## 2019-01-22 LAB — MAGNESIUM: Magnesium: 2.1 mg/dL (ref 1.7–2.4)

## 2019-01-22 MED ORDER — ENOXAPARIN SODIUM 40 MG/0.4ML ~~LOC~~ SOLN
40.0000 mg | SUBCUTANEOUS | Status: DC
  Administered 2019-01-25 – 2019-01-27 (×3): 40 mg via SUBCUTANEOUS
  Filled 2019-01-22 (×3): qty 0.4

## 2019-01-22 MED ORDER — PRAMIPEXOLE DIHYDROCHLORIDE 0.25 MG PO TABS
0.2500 mg | ORAL_TABLET | Freq: Two times a day (BID) | ORAL | Status: DC
  Administered 2019-01-22 – 2019-01-28 (×12): 0.25 mg via ORAL
  Filled 2019-01-22 (×12): qty 1

## 2019-01-22 MED ORDER — VITAMIN B-12 1000 MCG PO TABS
1000.0000 ug | ORAL_TABLET | Freq: Every day | ORAL | Status: DC
  Administered 2019-01-23 – 2019-01-28 (×6): 1000 ug via ORAL
  Filled 2019-01-22 (×6): qty 1

## 2019-01-22 MED ORDER — CIPROFLOXACIN IN D5W 400 MG/200ML IV SOLN
400.0000 mg | Freq: Once | INTRAVENOUS | Status: AC
  Administered 2019-01-22: 400 mg via INTRAVENOUS
  Filled 2019-01-22: qty 200

## 2019-01-22 MED ORDER — ACETAMINOPHEN 325 MG PO TABS: 650.0000 mg | ORAL_TABLET | Freq: Four times a day (QID) | ORAL | Status: DC | PRN

## 2019-01-22 MED ORDER — FLUOXETINE HCL 10 MG PO CAPS
10.0000 mg | ORAL_CAPSULE | Freq: Every day | ORAL | Status: DC
  Administered 2019-01-23 – 2019-01-28 (×6): 10 mg via ORAL
  Filled 2019-01-22 (×6): qty 1

## 2019-01-22 MED ORDER — ALPRAZOLAM 0.25 MG PO TABS
0.2500 mg | ORAL_TABLET | Freq: Three times a day (TID) | ORAL | Status: DC | PRN
  Administered 2019-01-22 – 2019-01-28 (×12): 0.25 mg via ORAL
  Filled 2019-01-22 (×12): qty 1

## 2019-01-22 MED ORDER — POLYETHYLENE GLYCOL 3350 17 G PO PACK: 17.0000 g | PACK | Freq: Every day | ORAL | Status: DC | PRN

## 2019-01-22 MED ORDER — HYDRALAZINE HCL 20 MG/ML IJ SOLN: 10.0000 mg | Freq: Four times a day (QID) | INTRAMUSCULAR | Status: DC | PRN

## 2019-01-22 MED ORDER — TRAMADOL HCL 50 MG PO TABS
100.0000 mg | ORAL_TABLET | Freq: Two times a day (BID) | ORAL | Status: DC
  Administered 2019-01-23 – 2019-01-27 (×9): 100 mg via ORAL
  Filled 2019-01-22 (×11): qty 2

## 2019-01-22 MED ORDER — OCUVITE-LUTEIN PO CAPS
2.0000 | ORAL_CAPSULE | Freq: Every day | ORAL | Status: DC
  Administered 2019-01-23 – 2019-01-28 (×6): 2 via ORAL
  Filled 2019-01-22 (×6): qty 2

## 2019-01-22 MED ORDER — SODIUM CHLORIDE 0.9 % IV BOLUS
500.0000 mL | Freq: Once | INTRAVENOUS | Status: AC
  Administered 2019-01-22: 500 mL via INTRAVENOUS

## 2019-01-22 MED ORDER — ACETAMINOPHEN 325 MG PO TABS
650.0000 mg | ORAL_TABLET | Freq: Four times a day (QID) | ORAL | Status: DC | PRN
  Administered 2019-01-23: 650 mg via ORAL
  Filled 2019-01-22: qty 2

## 2019-01-22 MED ORDER — SODIUM CHLORIDE 0.9 % IV SOLN: 1.0000 g | Freq: Once | INTRAVENOUS | Status: DC

## 2019-01-22 MED ORDER — ACETAMINOPHEN 650 MG RE SUPP: 650.0000 mg | Freq: Four times a day (QID) | RECTAL | Status: DC | PRN

## 2019-01-22 MED ORDER — VITAMIN D3 25 MCG (1000 UNIT) PO TABS
1000.0000 [IU] | ORAL_TABLET | Freq: Every day | ORAL | Status: DC
  Administered 2019-01-23 – 2019-01-28 (×6): 1000 [IU] via ORAL
  Filled 2019-01-22 (×6): qty 1

## 2019-01-22 MED ORDER — SODIUM CHLORIDE 0.9 % IV SOLN
1.0000 g | INTRAVENOUS | Status: DC
  Administered 2019-01-23 – 2019-01-25 (×3): 1 g via INTRAVENOUS
  Filled 2019-01-22: qty 10
  Filled 2019-01-22 (×3): qty 1

## 2019-01-22 NOTE — ED Notes (Signed)
Pt wears depends, will need in and out cath for UA once in room.

## 2019-01-22 NOTE — ED Provider Notes (Signed)
[facility] Emergency Department Provider Note ____________________________________________   First MD Initiated Contact with Patient 01/22/19 1819     (approximate)  I have reviewed the triage vital signs and the nursing notes.   HISTORY  Chief Complaint Altered Mental Status  Level 5 caveat: History of present illness limited due to dementia/altered mental status  HPI Paige Vasquez is a 85 y.o. female who presents for decline in mental status over the last 2 weeks, gradual onset, and characterized by not recognizing her husband and believing that there is a stranger in her home.  Today the patient went to the neighbors to call the police because she was afraid of an intruder in the home.  The patient reports a headache but denies any other acute medical symptoms.  Past Medical History:  Diagnosis Date  . Cystitis   . GERD (gastroesophageal reflux disease)   . HTN (hypertension)   . RLS (restless legs syndrome)     Patient Active Problem List   Diagnosis Date Noted  . Bradycardia 01/22/2019  . UTI (urinary tract infection) 07/02/2018  . Falls 07/02/2018  . HTN (hypertension) 07/02/2018  . GERD (gastroesophageal reflux disease) 07/02/2018  . RLS (restless legs syndrome) 07/02/2018    Past Surgical History:  Procedure Laterality Date  . ABDOMINAL HYSTERECTOMY    . NASAL SINUS SURGERY      Prior to Admission medications   Medication Sig Start Date End Date Taking? Authorizing Provider  acetaminophen (TYLENOL) 500 MG tablet Take 500-1,000 mg by mouth every 6 (six) hours as needed for mild pain or fever.   Yes [provider]  ALPRAZolam (XANAX) 0.25 MG tablet Take 1 tablet (0.25 mg total) by mouth 2 (two) times daily as needed for anxiety. Patient taking differently: Take 0.25 mg by mouth 3 (three) times daily as needed for anxiety.  07/05/18  Yes Dustin Flock, MD  cholecalciferol (VITAMIN D) 25 MCG (1000 UT) tablet Take 1,000 Units  by mouth daily.   Yes [provider]  Cranberry-Vitamin C-Probiotic (AZO CRANBERRY) 250-30 MG TABS Take 1 tablet by mouth 2 (two) times daily.   Yes [provider]  FLUoxetine (PROZAC) 10 MG capsule Take 10 mg by mouth daily.   Yes [provider]  metoprolol tartrate (LOPRESSOR) 25 MG tablet Take 0.5 tablets (12.5 mg total) by mouth 2 (two) times daily as needed. Prn hr >110 Patient taking differently: Take 12.5 mg by mouth 2 (two) times daily.  07/05/18 07/05/19 Yes Dustin Flock, MD  Multiple Vitamins-Minerals (PRESERVISION AREDS 2+MULTI VIT) CAPS Take 2 capsules by mouth daily.   Yes [provider]  pramipexole (MIRAPEX) 0.25 MG tablet Take 0.25 mg by mouth 2 (two) times daily.   Yes [provider]  traMADol (ULTRAM) 50 MG tablet Take 1 tablet (50 mg total) by mouth every 6 (six) hours as needed. Patient taking differently: Take 100 mg by mouth 2 (two) times daily.  07/05/18  Yes Dustin Flock, MD  vitamin B-12 (CYANOCOBALAMIN) 1000 MCG tablet Take 1,000 mcg by mouth daily.   Yes [provider]  acetaminophen (TYLENOL) 325 MG tablet Take 2 tablets (650 mg total) by mouth every 6 (six) hours as needed for mild pain (or Fever >/= 101). 07/05/18   Dustin Flock, MD    Allergies Patient has no known allergies.  Family History  Problem Relation Age of Onset  . Colon cancer Mother   . Emphysema Father     Social History Social History  Tobacco Use  . Smoking status: Never Smoker  . Smokeless tobacco: Never Used  Substance Use Topics  . Alcohol use: Not on file  . Drug use: Not on file    Review of Systems Level 5 caveat: Unable to obtain review of systems due to dementia/altered mental status    ____________________________________________   PHYSICAL EXAM:  VITAL SIGNS: ED Triage Vitals  Enc Vitals Group     BP 01/22/19 1707 (!) 173/68     Pulse Rate 01/22/19 1707 (!) 44     Resp 01/22/19 1707 16     Temp  01/22/19 1707 97.8 F (36.6 C)     Temp Source 01/22/19 1707 Oral     SpO2 01/22/19 1707 98 %     Weight 01/22/19 1708 117 lb (53.1 kg)     Height --      Head Circumference --      [facility] Flow --      Pain Score 01/22/19 1708 0     Pain Loc --      Pain Edu? --      Excl. in [place]? --     Constitutional: Alert, oriented to person and place.  Frail but comfortable appearing and in no acute distress. Eyes: Conjunctivae are normal.  EOMI.  PERRLA. Head: Atraumatic. Nose: No congestion/rhinnorhea. Mouth/Throat: Mucous membranes are slightly dry.   Neck: Normal range of motion.  Cardiovascular: Low rate, regular rhythm. Grossly normal heart sounds.  Good peripheral circulation. Respiratory: Normal respiratory effort.  No retractions. Lungs CTAB. Gastrointestinal: Soft and nontender. No distention.  Genitourinary: No flank tenderness. Musculoskeletal: No lower extremity edema.  Extremities warm and well perfused.  Neurologic:  Normal speech and language.  Motor intact in all extremities.  No gross focal neurologic deficits are appreciated.  Skin:  Skin is warm and dry. No rash noted. Psychiatric: Calm and cooperative.  ____________________________________________   LABS (all labs ordered are listed, but only abnormal results are displayed)  Labs Reviewed  COMPREHENSIVE METABOLIC PANEL - Abnormal; Notable for the following components:      Result Value   Glucose, Bld 102 (*)    All other components within normal limits  URINALYSIS, COMPLETE (UACMP) WITH MICROSCOPIC - Abnormal; Notable for the following components:   Color, Urine STRAW (*)    APPearance CLEAR (*)    Leukocytes, UA MODERATE (*)    All other components within normal limits  URINE CULTURE  CBC  TROPONIN I   ____________________________________________  EKG  ED ECG REPORT I, Arta Silence, the attending physician, personally viewed and interpreted this ECG.  Date: 01/22/2019 EKG Time: 1712 Rate:  44 Rhythm: Sinus bradycardia QRS Axis: normal Intervals: normal ST/T Wave abnormalities: normal Narrative Interpretation: no evidence of acute ischemia  ____________________________________________  RADIOLOGY  CXR: Cardiomegaly with no focal infiltrate CT head: No ICH or other acute abnormality  ____________________________________________   PROCEDURES  Procedure(s) performed: No  Procedures  Critical Care performed: No ____________________________________________   INITIAL IMPRESSION / ASSESSMENT AND PLAN / ED COURSE  Pertinent labs & imaging results that were available during my care of the patient were reviewed by me and considered in my medical decision making (see chart for details).  85 year old female with a history of dementia and other PMH as noted above presents with worsening of her mental status over the last 2 weeks, now culminating in her not recognizing her husband and going to her neighbors to call the police on him.  She reports a headache but  denies any other acute medical symptoms.  I reviewed the past medical records in [place].  The patient was most recently admitted in July of last year with recurrent falls and possible UTI.  On exam, the patient is significantly bradycardic and hypertensive.  Her EKG shows sinus rhythm in the 40s although on the monitor she goes as low as 36 bpm.  However she appears to be asymptomatic from this.  The remainder of the exam is as described above.  Neuro exam is nonfocal.  Overall I suspect most likely progression of her dementia, although differential also includes infection or acute CNS cause as well as metabolic etiologies.  We will obtain lab work-up, CT head, chest x-ray and UA, and reassess.  ----------------------------------------- 8:47 PM on 01/22/2019 -----------------------------------------  Lab work-up is unremarkable except for UA which is consistent with possible UTI it appears similar to the patient's UA  from her admission last summer.  CT head shows no ICH or other acute findings to explain her symptoms.  Given her persistent bradycardia and the presence of likely UTI, she will require admission.  I signed the patient out to the hospitalist Dr. Posey Pronto. ____________________________________________   FINAL CLINICAL IMPRESSION(S) / ED DIAGNOSES  Final diagnoses:  Altered mental status, unspecified altered mental status type  Bradycardia  Urinary tract infection without hematuria, site unspecified      NEW MEDICATIONS STARTED DURING THIS VISIT:  New Prescriptions   No medications on file     Note:  This document was prepared using Dragon voice recognition software and may include unintentional dictation errors.    Arta Silence, MD 01/22/19 2047

## 2019-01-22 NOTE — ED Triage Notes (Signed)
Here for acute changes over last 2 weeks in mental status. Has dementia and has begun to not recognize husband and think a stranger was in her home. Did see PCP this week but changes were not brought up.  Today daughter reports pt went to neighbors to have cops called because thought husband broke into home.  HR at last office visit was 69, and 44 in triage; will obtain EKG. Basic labs per protocol ordered to r/o infection or electrolyte abnormalities.  Pt seems cooperative at this time.

## 2019-01-22 NOTE — H&P (Signed)
[place] [facility] [place] NAME: Paige Vasquez    MR#:  [id number]  DATE OF BIRTH:  1927-04-04  DATE OF ADMISSION:  01/22/2019  PRIMARY CARE PHYSICIAN: Rusty Aus, MD   REQUESTING/REFERRING PHYSICIAN: Dr Cherylann Banas  CHIEF COMPLAINT:  Confused, found tp have HR in the 30's and 40's  HISTORY OF PRESENT ILLNESS:  Paige Vasquez  is a 85 y.o. female with a known history of advanced dementia, hypertension, restless leg syndrome comes to the emergency room with daughter. Patient has Alzheimer's dementia which has been progressively declining for last six months according to the daughter. Patient has been quite difficult at home and lives with her husband. She at times becomes suspicious about her husband and does not listen to him. Today she went to the neighbors house to call on cops and asked her husband to leave the home.  In the ER patient was found to have UTI. She was also noted to have heart rate upper 30s to 40s. She is on metoprolol which has been held.  She is being admitted for bradycardia, UTI and worsening dementia.  PAST MEDICAL HISTORY:   Past Medical History:  Diagnosis Date  . Cystitis   . GERD (gastroesophageal reflux disease)   . HTN (hypertension)   . RLS (restless legs syndrome)     PAST SURGICAL HISTOIRY:   Past Surgical History:  Procedure Laterality Date  . ABDOMINAL HYSTERECTOMY    . NASAL SINUS SURGERY      SOCIAL HISTORY:   Social History   Tobacco Use  . Smoking status: Never Smoker  . Smokeless tobacco: Never Used  Substance Use Topics  . Alcohol use: Not on file    FAMILY HISTORY:   Family History  Problem Relation Age of Onset  . Colon cancer Mother   . Emphysema Father     DRUG ALLERGIES:  No Known Allergies  REVIEW OF SYSTEMS:  Review of Systems  Unable to perform ROS: Dementia     MEDICATIONS AT HOME:   Prior to Admission medications   Medication Sig Start Date End Date Taking?  Authorizing Provider  acetaminophen (TYLENOL) 500 MG tablet Take 500-1,000 mg by mouth every 6 (six) hours as needed for mild pain or fever.   Yes [provider]  ALPRAZolam (XANAX) 0.25 MG tablet Take 1 tablet (0.25 mg total) by mouth 2 (two) times daily as needed for anxiety. Patient taking differently: Take 0.25 mg by mouth 3 (three) times daily as needed for anxiety.  07/05/18  Yes Dustin Flock, MD  cholecalciferol (VITAMIN D) 25 MCG (1000 UT) tablet Take 1,000 Units by mouth daily.   Yes [provider]  Cranberry-Vitamin C-Probiotic (AZO CRANBERRY) 250-30 MG TABS Take 1 tablet by mouth 2 (two) times daily.   Yes [provider]  FLUoxetine (PROZAC) 10 MG capsule Take 10 mg by mouth daily.   Yes [provider]  metoprolol tartrate (LOPRESSOR) 25 MG tablet Take 0.5 tablets (12.5 mg total) by mouth 2 (two) times daily as needed. Prn hr >110 Patient taking differently: Take 12.5 mg by mouth 2 (two) times daily.  07/05/18 07/05/19 Yes Dustin Flock, MD  Multiple Vitamins-Minerals (PRESERVISION AREDS 2+MULTI VIT) CAPS Take 2 capsules by mouth daily.   Yes [provider]  pramipexole (MIRAPEX) 0.25 MG tablet Take 0.25 mg by mouth 2 (two) times daily.   Yes [provider]  traMADol (ULTRAM) 50 MG tablet Take 1 tablet (50 mg total)  by mouth every 6 (six) hours as needed. Patient taking differently: Take 100 mg by mouth 2 (two) times daily.  07/05/18  Yes Dustin Flock, MD  vitamin B-12 (CYANOCOBALAMIN) 1000 MCG tablet Take 1,000 mcg by mouth daily.   Yes [provider]  acetaminophen (TYLENOL) 325 MG tablet Take 2 tablets (650 mg total) by mouth every 6 (six) hours as needed for mild pain (or Fever >/= 101). 07/05/18   Dustin Flock, MD      VITAL SIGNS:  Blood pressure (!) 183/59, pulse (!) 37, temperature 97.8 F (36.6 C), temperature source Oral, resp. rate (!) 7, weight 53.1 kg, SpO2 98 %.  PHYSICAL EXAMINATION:  GENERAL:   85 y.o.-year-old patient lying in the bed with no acute distress.  EYES: Pupils equal, round, reactive to light and accommodation. No scleral icterus. Extraocular muscles intact.  HEENT: Head atraumatic, normocephalic. Oropharynx and nasopharynx clear.  NECK:  Supple, no jugular venous distention. No thyroid enlargement, no tenderness.  LUNGS: Normal breath sounds bilaterally, no wheezing, rales,rhonchi or crepitation. No use of accessory muscles of respiration.  CARDIOVASCULAR: S1, S2 normal. No murmurs, rubs, or gallops.  ABDOMEN: Soft, nontender, nondistended. Bowel sounds present. No organomegaly or mass.  EXTREMITIES: No pedal edema, cyanosis, or clubbing.  NEUROLOGIC: Cranial nerves II through XII are intact. Muscle strength 5/5 in all extremities. Sensation intact. Gait not checked.  PSYCHIATRIC: The patient is alert and oriented x 3.  SKIN: No obvious rash, lesion, or ulcer.   LABORATORY PANEL:   CBC Recent Labs  Lab 01/22/19 1714  WBC 9.7  HGB 14.2  HCT 43.0  PLT 227   ------------------------------------------------------------------------------------------------------------------  Chemistries  Recent Labs  Lab 01/22/19 1714  NA 136  K 3.7  CL 102  CO2 28  GLUCOSE 102*  BUN 22  CREATININE 0.81  CALCIUM 9.0  AST 26  ALT 20  ALKPHOS 68  BILITOT 0.6   ------------------------------------------------------------------------------------------------------------------  Cardiac Enzymes Recent Labs  Lab 01/22/19 1821  TROPONINI <0.03   ------------------------------------------------------------------------------------------------------------------  RADIOLOGY:  Ct Head Wo Contrast  Result Date: 01/22/2019 CLINICAL DATA:  Here for acute changes over last 2 weeks in mental status. Has dementia and has begun to not recognize husband and think a stranger was in her home. Did see PCP this week but changes were not brought up. Today daughter reports pt went to  neighbors to have cops called because thought husband broke into home. EXAM: CT HEAD WITHOUT CONTRAST TECHNIQUE: Contiguous axial images were obtained from the base of the skull through the vertex without intravenous contrast. COMPARISON:  09/30/2006 FINDINGS: Brain: No evidence of acute infarction, hemorrhage, hydrocephalus, extra-axial collection or mass lesion/mass effect. There is ventricular and sulcal enlargement reflecting age-appropriate volume loss. Mild patchy white matter hypoattenuation is also noted consistent with chronic microvascular ischemic change. Vascular: No hyperdense vessel or unexpected calcification. There is a small amount of air adjacent to the cavernous portions of the intracranial internal carotid arteries. Air is seen adjacent to the orbital apices. A trace amount of air is seen below the skull base below the horizontal portion of the carotid canal and in the left temporal region. Skull: No fracture or skull lesion. No visualized skull base fracture defect to account the small amount skull base air. Sinuses/Orbits: Visualize globes and orbits are unremarkable. Visualized sinuses and mastoid air cells are clear. Other: None. IMPRESSION: 1. No acute intracranial abnormalities. 2. Age-appropriate volume loss.  Mild small vessel ischemic change. 3. Small amount of air is seen at  the skull base primarily adjacent to the cavernous carotid arteries. Etiology of this is unclear. No visualized skull base fracture sinus wall defect. Consider follow-up maxillofacial CT for further assessment. Electronically Signed   By: Lajean Manes M.D.   On: 01/22/2019 19:23   Dg Chest Portable 1 View  Result Date: 01/22/2019 CLINICAL DATA:  Weakness with altered mental status. EXAM: PORTABLE CHEST 1 VIEW COMPARISON:  08/12/2014 FINDINGS: 1822 hours. Lungs are hyperexpanded. Interstitial markings are diffusely coarsened with chronic features. The lungs are clear without focal pneumonia, edema, pneumothorax  or pleural effusion. The cardio pericardial silhouette is enlarged. Large hiatal hernia evident. Bones are diffusely demineralized. Telemetry leads overlie the chest. IMPRESSION: 1. Cardiomegaly with underlying chronic interstitial changes. No acute cardiopulmonary findings. 2. Large hiatal hernia. Electronically Signed   By: Misty Stanley M.D.   On: 01/22/2019 18:43    EKG:   sinus bradycardia IMPRESSION AND PLAN:   Paige Vasquez  is a 85 y.o. female with a known history of advanced dementia, hypertension, restless leg syndrome comes to the emergency room with daughter. Patient has Alzheimer's dementia which has been progressively declining for last six months according to the daughte  1. Sinus bradycardia -patient on metoprolol will hold -continue telemonitoring -cardiology consultation with Dr. Josefa Half-- said sent via secure chat  2. UTI -IV Rocephin  3. Advance/progressive dementia with intermittent aggressive behavior -discussed with patient's daughter at length. Husband wants patient at home however has been difficult to manage patient at home per daughter. Discussed with daughter that I will have care manager get in touch with family to see and discuss different options at home with patient to help her husband as well  4. Hypertension PRN hydralazine  5. DVT prophylaxis subcu Lovenox    All the records are reviewed and case discussed with ED provider.   CODE STATUS: DNR  TOTAL TIME TAKING CARE OF THIS PATIENT: *50 minutes.    Fritzi Mandes M.D on 01/22/2019 at 8:37 PM  Between 7am to 6pm - Pager - [phone]  After 6pm go to [email or web address] - password [email or web address] [facility]  [facility]  Office  [phone]  CC: Primary care physician; Rusty Aus, MD

## 2019-01-22 NOTE — Progress Notes (Signed)
Family Meeting Note  Advance Directive:dnr Today a meeting took place with the dter patient comes in with worsening/progressive dementia patient was found to have UTI and bradycardia on EKG and telemonitoring. Code status discussed with patient's daughter who states she is a DNR. Time spent during discussion 16 mins Fritzi Mandes, MD

## 2019-01-22 NOTE — ED Notes (Signed)
ED Provider at bedside. 

## 2019-01-23 MED ORDER — HYDROCHLOROTHIAZIDE 12.5 MG PO CAPS
12.5000 mg | ORAL_CAPSULE | Freq: Every day | ORAL | Status: DC
  Administered 2019-01-23 – 2019-01-27 (×5): 12.5 mg via ORAL
  Filled 2019-01-23 (×5): qty 1

## 2019-01-23 MED ORDER — LOSARTAN POTASSIUM 50 MG PO TABS
50.0000 mg | ORAL_TABLET | Freq: Every day | ORAL | Status: DC
  Administered 2019-01-23 – 2019-01-27 (×5): 50 mg via ORAL
  Filled 2019-01-23 (×5): qty 1

## 2019-01-23 NOTE — Evaluation (Signed)
Physical Therapy Evaluation Patient Details Name: Paige Vasquez MRN: [id number] DOB: 06-Jun-1927 Today's Date: 01/23/2019   History of Present Illness  pt is a 85 y.o F addmitted on 01/22/2019 with dx of bradycardia. history of advanced dementia, hypertension, restless leg syndrome. In the ED pt was dx with a UTI and heart rate was in the 30's-40's.   Clinical Impression  Patient currently in bed with a sitter when session began. She is A&O to person and place only. She demonstrates MODI bed mobility and CGA for transfers with RW for safety. She reported 2/10 pain in the bil LE along the shins that improved with mobility. She amb ~80 ft with RW requiring cues for proper use. Her during gait her HR increased to 132  and oxygen satted at 86% on room air. She would benefit from continued physical therapy to promote endurance, strength and general mobility. Following discharge pt would benefit from continued physical therapy via HHPT.     Follow Up Recommendations Home health PT    Equipment Recommendations  None recommended by PT(pt has RW at home)    Recommendations for Other Services       Precautions / Restrictions Precautions Precautions: Fall Restrictions Weight Bearing Restrictions: No      Mobility  Bed Mobility Overal bed mobility: Modified Independent             General bed mobility comments: increased time and verbal cues for hand positioning  Transfers Overall transfer level: Modified independent Equipment used: Rolling walker (2 wheeled)             General transfer comment: +1 CGA for safety  Ambulation/Gait Ambulation/Gait assistance: Min guard Gait Distance (Feet): 80 Feet Assistive device: Rolling walker (2 wheeled) Gait Pattern/deviations: Decreased stride length;Step-through pattern;Trendelenburg;Antalgic;Trunk flexed;Narrow base of support;Shuffle        Stairs            Wheelchair Mobility    Modified Rankin (Stroke Patients Only)        Balance                                             Pertinent Vitals/Pain Pain Assessment: 0-10 Pain Score: 2  Pain Location: bil LE along shins Pain Descriptors / Indicators: Aching;Sore Pain Intervention(s): Monitored during session    Home Living Family/patient expects to be discharged to:: Private residence Living Arrangements: Spouse/significant other   Type of Home: House Home Access: Stairs to enter Entrance Stairs-Rails: None Technical brewer of Steps: 2 Home Layout: One level Home Equipment: Environmental consultant - 2 wheels      Prior Function Level of Independence: Independent with assistive device(s)               Hand Dominance   Dominant Hand: Right    Extremity/Trunk Assessment   Upper Extremity Assessment Upper Extremity Assessment: Overall WFL for tasks assessed    Lower Extremity Assessment Lower Extremity Assessment: RLE deficits/detail;LLE deficits/detail RLE Deficits / Details: 4/5 (frequent cues required for assessment) LLE Deficits / Details: 4/5(frequent cues required for assessment)       Communication   Communication: HOH  Cognition Arousal/Alertness: Awake/alert                                     General Comments: A&O to  person and place      General Comments      Exercises Other Exercises Other Exercises: Seated marching 2 x 15, LAQ 2 x 10,  Other Exercises: deep pursed lipped breathing increasing O2 sat from 86% to 91 while sitting on EOB   Assessment/Plan    PT Assessment Patient needs continued PT services  PT Problem List Decreased strength;Decreased activity tolerance;Decreased balance;Decreased cognition;Decreased coordination;Pain;Decreased safety awareness;Decreased mobility       PT Treatment Interventions Gait training;Therapeutic activities;Therapeutic exercise;Stair training;Balance training;Functional mobility training;Patient/family education    PT Goals (Current goals  can be found in the Care Plan section)  Acute Rehab PT Goals Patient Stated Goal: to go home PT Goal Formulation: With patient Time For Goal Achievement: 02/06/19 Potential to Achieve Goals: Fair    Frequency Min 2X/week   Barriers to discharge        Co-evaluation               AM-PAC PT "6 Clicks" Mobility  Outcome Measure Help needed turning from your back to your side while in a flat bed without using bedrails?: A Little Help needed moving from lying on your back to sitting on the side of a flat bed without using bedrails?: A Little Help needed moving to and from a bed to a chair (including a wheelchair)?: A Little Help needed standing up from a chair using your arms (e.g., wheelchair or bedside chair)?: A Lot Help needed to walk in hospital room?: A Little Help needed climbing 3-5 steps with a railing? : Total 6 Click Score: 15    End of Session Equipment Utilized During Treatment: Gait belt Activity Tolerance: Patient limited by fatigue;Patient limited by pain;Patient tolerated treatment well Patient left: in chair;with call bell/phone within reach;with nursing/sitter in room Nurse Communication: Mobility status PT Visit Diagnosis: Unsteadiness on feet (R26.81);History of falling (Z91.81);Ataxic gait (R26.0);Pain;Difficulty in walking, not elsewhere classified (R26.2);Muscle weakness (generalized) (M62.81) Pain - Right/Left: (bil) Pain - part of body: Leg    Time: 9379-0240 PT Time Calculation (min) (ACUTE ONLY): 37 min   Charges:   PT Evaluation $PT Eval Moderate Complexity: 1 Mod PT Treatments $Gait Training: 8-22 mins $Therapeutic Exercise: 8-22 mins        Paige Vasquez PT, DPT, LAT, ATC  01/23/19  10:56 AM       Paige Vasquez, Paige Vasquez 01/23/2019, 10:51 AM

## 2019-01-23 NOTE — H&P (Signed)
[place] at [place] NAME: Paige Vasquez    MR#:  [id number]  DATE OF BIRTH:  1927-03-01  SUBJECTIVE:  Patient is confused and disoriented at baseline, did discuss patient with the patient's daughter-all questions answered, no events overnight per nursing staff  REVIEW OF SYSTEMS:  CONSTITUTIONAL: No fever, fatigue or weakness.  EYES: No blurred or double vision.  EARS, NOSE, AND THROAT: No tinnitus or ear pain.  RESPIRATORY: No cough, shortness of breath, wheezing or hemoptysis.  CARDIOVASCULAR: No chest pain, orthopnea, edema.  GASTROINTESTINAL: No nausea, vomiting, diarrhea or abdominal pain.  GENITOURINARY: No dysuria, hematuria.  ENDOCRINE: No polyuria, nocturia,  HEMATOLOGY: No anemia, easy bruising or bleeding SKIN: No rash or lesion. MUSCULOSKELETAL: No joint pain or arthritis.   NEUROLOGIC: No tingling, numbness, weakness.  PSYCHIATRY: No anxiety or depression.   ROS  DRUG ALLERGIES:  No Known Allergies  VITALS:  Blood pressure (!) 169/51, pulse (!) 42, temperature 97.8 F (36.6 C), resp. rate 20, height 5\' 3"  (1.6 m), weight 54.4 kg, SpO2 93 %.  PHYSICAL EXAMINATION:  GENERAL:  85 y.o.-year-old patient lying in the bed with no acute distress.  EYES: Pupils equal, round, reactive to light and accommodation. No scleral icterus. Extraocular muscles intact.  HEENT: Head atraumatic, normocephalic. Oropharynx and nasopharynx clear.  NECK:  Supple, no jugular venous distention. No thyroid enlargement, no tenderness.  LUNGS: Normal breath sounds bilaterally, no wheezing, rales,rhonchi or crepitation. No use of accessory muscles of respiration.  CARDIOVASCULAR: S1, S2 normal. No murmurs, rubs, or gallops.  ABDOMEN: Soft, nontender, nondistended. Bowel sounds present. No organomegaly or mass.  EXTREMITIES: No pedal edema, cyanosis, or clubbing.  NEUROLOGIC: Cranial nerves II through XII are intact. Muscle strength 5/5 in all  extremities. Sensation intact. Gait not checked.  PSYCHIATRIC: The patient is alert and oriented x 3.  SKIN: No obvious rash, lesion, or ulcer.   Physical Exam LABORATORY PANEL:   CBC Recent Labs  Lab 01/22/19 1714  WBC 9.7  HGB 14.2  HCT 43.0  PLT 227   ------------------------------------------------------------------------------------------------------------------  Chemistries  Recent Labs  Lab 01/22/19 1714 01/22/19 1821  NA 136  --   K 3.7  --   CL 102  --   CO2 28  --   GLUCOSE 102*  --   BUN 22  --   CREATININE 0.81  --   CALCIUM 9.0  --   MG  --  2.1  AST 26  --   ALT 20  --   ALKPHOS 68  --   BILITOT 0.6  --    ------------------------------------------------------------------------------------------------------------------  Cardiac Enzymes Recent Labs  Lab 01/22/19 1821  TROPONINI <0.03   ------------------------------------------------------------------------------------------------------------------  RADIOLOGY:  Ct Head Wo Contrast  Result Date: 01/22/2019 CLINICAL DATA:  Here for acute changes over last 2 weeks in mental status. Has dementia and has begun to not recognize husband and think a stranger was in her home. Did see PCP this week but changes were not brought up. Today daughter reports pt went to neighbors to have cops called because thought husband broke into home. EXAM: CT HEAD WITHOUT CONTRAST TECHNIQUE: Contiguous axial images were obtained from the base of the skull through the vertex without intravenous contrast. COMPARISON:  09/30/2006 FINDINGS: Brain: No evidence of acute infarction, hemorrhage, hydrocephalus, extra-axial collection or mass lesion/mass effect. There is ventricular and sulcal enlargement reflecting age-appropriate volume loss. Mild patchy white matter hypoattenuation is also noted consistent with chronic microvascular ischemic change.  Vascular: No hyperdense vessel or unexpected calcification. There is a small amount of air  adjacent to the cavernous portions of the intracranial internal carotid arteries. Air is seen adjacent to the orbital apices. A trace amount of air is seen below the skull base below the horizontal portion of the carotid canal and in the left temporal region. Skull: No fracture or skull lesion. No visualized skull base fracture defect to account the small amount skull base air. Sinuses/Orbits: Visualize globes and orbits are unremarkable. Visualized sinuses and mastoid air cells are clear. Other: None. IMPRESSION: 1. No acute intracranial abnormalities. 2. Age-appropriate volume loss.  Mild small vessel ischemic change. 3. Small amount of air is seen at the skull base primarily adjacent to the cavernous carotid arteries. Etiology of this is unclear. No visualized skull base fracture sinus wall defect. Consider follow-up maxillofacial CT for further assessment. Electronically Signed   By: Lajean Manes M.D.   On: 01/22/2019 19:23   Dg Chest Portable 1 View  Result Date: 01/22/2019 CLINICAL DATA:  Weakness with altered mental status. EXAM: PORTABLE CHEST 1 VIEW COMPARISON:  08/12/2014 FINDINGS: 1822 hours. Lungs are hyperexpanded. Interstitial markings are diffusely coarsened with chronic features. The lungs are clear without focal pneumonia, edema, pneumothorax or pleural effusion. The cardio pericardial silhouette is enlarged. Large hiatal hernia evident. Bones are diffusely demineralized. Telemetry leads overlie the chest. IMPRESSION: 1. Cardiomegaly with underlying chronic interstitial changes. No acute cardiopulmonary findings. 2. Large hiatal hernia. Electronically Signed   By: Misty Stanley M.D.   On: 01/22/2019 18:43    ASSESSMENT AND PLAN:  Paige Vasquez  is a 85 y.o. female with a known history of advanced dementia, hypertension, restless leg syndrome comes to the emergency room with daughter. Patient has Alzheimer's dementia which has been progressively declining for last six months according to the  daughte  *Acute sinus bradycardia Suspect due to beta-blocker therapy Continue to hold beta-blocker, cardiology to see  *Acute UTI Stable Continue empiric Rocephin and follow-up on cultures  *Chronic Advanced/progressive dementia with intermittent aggressive behavior Stable D/W patient's daughter at length. Husband wants patient at home however has been difficult to manage patient at home per daughter, social work/case management to assist with disposition planning  *Chronic hypertension  Stable on current regiment   DVT prophylaxis-subcu Lovenox  All the records are reviewed and case discussed with Care Management/Social Workerr. Management plans discussed with the patient, family and they are in agreement.  CODE STATUS: dnr  TOTAL TIME TAKING CARE OF THIS PATIENT: 35 minutes.     POSSIBLE D/C IN 1-3 DAYS, DEPENDING ON CLINICAL CONDITION.   Avel Peace  M.D on 01/23/2019   Between 7am to 6pm - Pager - [phone]  After 6pm go to [email or web address] - password [email or web address] [facility]  Office  [phone]  CC: Primary care physician; Rusty Aus, MD  Note: This dictation was prepared with Dragon dictation along with smaller phrase technology. Any transcriptional errors that result from this process are unintentional.

## 2019-01-23 NOTE — Consult Note (Signed)
[facility] Cardiology  CARDIOLOGY CONSULT NOTE  Patient ID: Paige Vasquez MRN: [id number] DOB/AGE: [birth year] 85 y.o.  Admit date: 01/22/2019 Referring Physician Salary Primary Physician [facility] Primary Cardiologist  Reason for Consultation bradycardia  HPI: 85 year old female referred for bradycardia.  Patient has known's Alzheimer's dementia with progressive decline over the last 6 months.  On the day of admission, patient went to her neighbor's house, called the police asked her husband to leave the house.  He was brought into [facility] ED by her daughter he was noted to have a urinary tract infection.  Incidentally, the patient was also to be bradycardic, with ECG revealing sinus bradycardia at a rate of 44 bpm.  Currently is on metoprolol 25 mg twice daily.  Below was withheld, heart rate is improved, telemetry shows sinus bradycardia at a rate of 54 bpm.  Patient is asymptomatic, denies chest pain, shortness of breath, palpitations, presyncope or syncope.  Review of systems complete and found to be negative unless listed above     Past Medical History:  Diagnosis Date  . Cystitis   . GERD (gastroesophageal reflux disease)   . HTN (hypertension)   . RLS (restless legs syndrome)     Past Surgical History:  Procedure Laterality Date  . ABDOMINAL HYSTERECTOMY    . NASAL SINUS SURGERY      Medications Prior to Admission  Medication Sig Dispense Refill Last Dose  . acetaminophen (TYLENOL) 500 MG tablet Take 500-1,000 mg by mouth every 6 (six) hours as needed for mild pain or fever.   Unknown at PRN  . ALPRAZolam (XANAX) 0.25 MG tablet Take 1 tablet (0.25 mg total) by mouth 2 (two) times daily as needed for anxiety. (Patient taking differently: Take 0.25 mg by mouth 3 (three) times daily as needed for anxiety. ) 30 tablet 0 01/22/2019 at PRN  . cholecalciferol (VITAMIN D) 25 MCG (1000 UT) tablet Take 1,000 Units by mouth daily.   01/22/2019 at 0800  . Cranberry-Vitamin C-Probiotic (AZO CRANBERRY)  250-30 MG TABS Take 1 tablet by mouth 2 (two) times daily.   01/22/2019 at 1630  . FLUoxetine (PROZAC) 10 MG capsule Take 10 mg by mouth daily.   01/22/2019 at 0800  . metoprolol tartrate (LOPRESSOR) 25 MG tablet Take 0.5 tablets (12.5 mg total) by mouth 2 (two) times daily as needed. Prn hr >110 (Patient taking differently: Take 12.5 mg by mouth 2 (two) times daily. ) 60 tablet 11 01/22/2019 at 1630  . Multiple Vitamins-Minerals (PRESERVISION AREDS 2+MULTI VIT) CAPS Take 2 capsules by mouth daily.   01/22/2019 at 0800  . pramipexole (MIRAPEX) 0.25 MG tablet Take 0.25 mg by mouth 2 (two) times daily.   01/22/2019 at 1630  . traMADol (ULTRAM) 50 MG tablet Take 1 tablet (50 mg total) by mouth every 6 (six) hours as needed. (Patient taking differently: Take 100 mg by mouth 2 (two) times daily. ) 30 tablet 0 01/22/2019 at 1630  . vitamin B-12 (CYANOCOBALAMIN) 1000 MCG tablet Take 1,000 mcg by mouth daily.   01/22/2019 at 0800  . acetaminophen (TYLENOL) 325 MG tablet Take 2 tablets (650 mg total) by mouth every 6 (six) hours as needed for mild pain (or Fever >/= 101).      Social History   Socioeconomic History  . Marital status: Married    Spouse name: Not on file  . Number of children: Not on file  . Years of education: Not on file  . Highest education level: Not on file  Occupational  History  . Not on file  Social Needs  . Financial resource strain: Not on file  . Food insecurity:    Worry: Not on file    Inability: Not on file  . Transportation needs:    Medical: Not on file    Non-medical: Not on file  Tobacco Use  . Smoking status: Never Smoker  . Smokeless tobacco: Never Used  Substance and Sexual Activity  . Alcohol use: Not on file  . Drug use: Not on file  . Sexual activity: Not on file  Lifestyle  . Physical activity:    Days per week: Not on file    Minutes per session: Not on file  . Stress: Not on file  Relationships  . Social connections:    Talks on phone: Not on file    Gets  together: Not on file    Attends religious service: Not on file    Active member of club or organization: Not on file    Attends meetings of clubs or organizations: Not on file    Relationship status: Not on file  . Intimate partner violence:    Fear of current or ex partner: Not on file    Emotionally abused: Not on file    Physically abused: Not on file    Forced sexual activity: Not on file  Other Topics Concern  . Not on file  Social History Narrative  . Not on file    Family History  Problem Relation Age of Onset  . Colon cancer Mother   . Emphysema Father       Review of systems complete and found to be negative unless listed above      PHYSICAL EXAM  General: Well developed, well nourished, in no acute distress HEENT:  Normocephalic and atramatic Neck:  No JVD.  Lungs: Clear bilaterally to auscultation and percussion. Heart: HRRR . Normal S1 and S2 without gallops or murmurs.  Abdomen: Bowel sounds are positive, abdomen soft and non-tender  Msk:  Back normal, normal gait. Normal strength and tone for age. Extremities: No clubbing, cyanosis or edema.   Neuro: Alert and oriented X 3. Psych:  Good affect, responds appropriately  Labs:   Lab Results  Component Value Date   WBC 9.7 01/22/2019   HGB 14.2 01/22/2019   HCT 43.0 01/22/2019   MCV 86.7 01/22/2019   PLT 227 01/22/2019    Recent Labs  Lab 01/22/19 1714  NA 136  K 3.7  CL 102  CO2 28  BUN 22  CREATININE 0.81  CALCIUM 9.0  PROT 7.1  BILITOT 0.6  ALKPHOS 68  ALT 20  AST 26  GLUCOSE 102*   Lab Results  Component Value Date   TROPONINI <0.03 01/22/2019   No results found for: CHOL No results found for: HDL No results found for: LDLCALC No results found for: TRIG No results found for: CHOLHDL No results found for: LDLDIRECT    Radiology: Ct Head Wo Contrast  Result Date: 01/22/2019 CLINICAL DATA:  Here for acute changes over last 2 weeks in mental status. Has dementia and has begun to  not recognize husband and think a stranger was in her home. Did see PCP this week but changes were not brought up. Today daughter reports pt went to neighbors to have cops called because thought husband broke into home. EXAM: CT HEAD WITHOUT CONTRAST TECHNIQUE: Contiguous axial images were obtained from the base of the skull through the vertex without intravenous contrast. COMPARISON:  09/30/2006 FINDINGS: Brain: No evidence of acute infarction, hemorrhage, hydrocephalus, extra-axial collection or mass lesion/mass effect. There is ventricular and sulcal enlargement reflecting age-appropriate volume loss. Mild patchy white matter hypoattenuation is also noted consistent with chronic microvascular ischemic change. Vascular: No hyperdense vessel or unexpected calcification. There is a small amount of air adjacent to the cavernous portions of the intracranial internal carotid arteries. Air is seen adjacent to the orbital apices. A trace amount of air is seen below the skull base below the horizontal portion of the carotid canal and in the left temporal region. Skull: No fracture or skull lesion. No visualized skull base fracture defect to account the small amount skull base air. Sinuses/Orbits: Visualize globes and orbits are unremarkable. Visualized sinuses and mastoid air cells are clear. Other: None. IMPRESSION: 1. No acute intracranial abnormalities. 2. Age-appropriate volume loss.  Mild small vessel ischemic change. 3. Small amount of air is seen at the skull base primarily adjacent to the cavernous carotid arteries. Etiology of this is unclear. No visualized skull base fracture sinus wall defect. Consider follow-up maxillofacial CT for further assessment. Electronically Signed   By: Lajean Manes M.D.   On: 01/22/2019 19:23   Dg Chest Portable 1 View  Result Date: 01/22/2019 CLINICAL DATA:  Weakness with altered mental status. EXAM: PORTABLE CHEST 1 VIEW COMPARISON:  08/12/2014 FINDINGS: 1822 hours. Lungs are  hyperexpanded. Interstitial markings are diffusely coarsened with chronic features. The lungs are clear without focal pneumonia, edema, pneumothorax or pleural effusion. The cardio pericardial silhouette is enlarged. Large hiatal hernia evident. Bones are diffusely demineralized. Telemetry leads overlie the chest. IMPRESSION: 1. Cardiomegaly with underlying chronic interstitial changes. No acute cardiopulmonary findings. 2. Large hiatal hernia. Electronically Signed   By: Misty Stanley M.D.   On: 01/22/2019 18:43    EKG: Sinus bradycardia at a rate of 44 bpm  ASSESSMENT AND PLAN:   1.  Sinus bradycardia, secondary to metoprolol, which has improved after holding metoprolol, asymptomatic 2.  UTI 3.  Alzheimer's dementia  Recommendations  1.  Agree with current therapy 2.  Hold metoprolol tartrate 3.  No current indication for temporary or permanent pacemaker 4.  Add losartan/HCTZ 50/12.5 mg 5.  Defer further cardiac diagnostics at this time  Signed: Isaias Cowman MD,PhD, [facility] 01/23/2019, 9:20 AM

## 2019-01-23 NOTE — Plan of Care (Signed)
  Problem: Activity: Goal: Risk for activity intolerance will decrease Outcome: Progressing   

## 2019-01-23 NOTE — Care Management (Signed)
Pt's daughter have expressed that they would like to have pt placed in a long term memory care facility. Pt has stated she can not go home with her husband. Pt does not recognize her husband per daughters and report. The daughters would like assistance with placing mom.   Thank you

## 2019-01-24 LAB — URINE CULTURE: Culture: 30000 — AB

## 2019-01-24 NOTE — Care Management (Signed)
Family would like patient to be placed in LTC.  Randall Hiss, CSW is involved; attempting SNF placement.

## 2019-01-24 NOTE — Progress Notes (Signed)
[facility] Cardiology  SUBJECTIVE: The patient reports feeling well this morning without chest pain or shortness of breath.   Vitals:   01/23/19 0825 01/23/19 1825 01/23/19 2300 01/24/19 0812  BP: (!) 169/51 (!) 135/46 (!) 143/112 90/61  Pulse: (!) 42 68 73 (!) 58  Resp: 20 18 15    Temp: 97.8 F (36.6 C) 97.6 F (36.4 C) 98.2 F (36.8 C) (!) 97.5 F (36.4 C)  TempSrc:  Oral Oral Oral  SpO2:  97% 94% 97%  Weight:      Height:         Intake/Output Summary (Last 24 hours) at 01/24/2019 1219 Last data filed at 01/24/2019 0215 Gross per 24 hour  Intake 200 ml  Output 1850 ml  Net -1650 ml      PHYSICAL EXAM  General: Well developed, well nourished, in no acute distress, sitting in chair eating breakfast HEENT:  Normocephalic and atramatic Neck:  No JVD.  Lungs: Clear bilaterally to auscultation and percussion. Heart: HRRR . Normal S1 and S2 without gallops or murmurs.  Abdomen: nondistended Msk:  Gait not assessed, no obvious deformity Extremities: No clubbing, cyanosis or edema.   Neuro: Alert and oriented X 3. Psych:  Good affect, responds appropriately   LABS: Basic Metabolic Panel: Recent Labs    01/22/19 1714 01/22/19 1821  NA 136  --   K 3.7  --   CL 102  --   CO2 28  --   GLUCOSE 102*  --   BUN 22  --   CREATININE 0.81  --   CALCIUM 9.0  --   MG  --  2.1  PHOS  --  2.7   Liver Function Tests: Recent Labs    01/22/19 1714  AST 26  ALT 20  ALKPHOS 68  BILITOT 0.6  PROT 7.1  ALBUMIN 3.9   No results for input(s): LIPASE, AMYLASE in the last 72 hours. CBC: Recent Labs    01/22/19 1714  WBC 9.7  HGB 14.2  HCT 43.0  MCV 86.7  PLT 227   Cardiac Enzymes: Recent Labs    01/22/19 1821  TROPONINI <0.03   BNP: Invalid input(s): POCBNP D-Dimer: No results for input(s): DDIMER in the last 72 hours. Hemoglobin A1C: No results for input(s): HGBA1C in the last 72 hours. Fasting Lipid Panel: No results for input(s): CHOL, HDL, LDLCALC, TRIG,  CHOLHDL, LDLDIRECT in the last 72 hours. Thyroid Function Tests: No results for input(s): TSH, T4TOTAL, T3FREE, THYROIDAB in the last 72 hours.  Invalid input(s): FREET3 Anemia Panel: No results for input(s): VITAMINB12, FOLATE, FERRITIN, TIBC, IRON, RETICCTPCT in the last 72 hours.  Ct Head Wo Contrast  Result Date: 01/22/2019 CLINICAL DATA:  Here for acute changes over last 2 weeks in mental status. Has dementia and has begun to not recognize husband and think a stranger was in her home. Did see PCP this week but changes were not brought up. Today daughter reports pt went to neighbors to have cops called because thought husband broke into home. EXAM: CT HEAD WITHOUT CONTRAST TECHNIQUE: Contiguous axial images were obtained from the base of the skull through the vertex without intravenous contrast. COMPARISON:  09/30/2006 FINDINGS: Brain: No evidence of acute infarction, hemorrhage, hydrocephalus, extra-axial collection or mass lesion/mass effect. There is ventricular and sulcal enlargement reflecting age-appropriate volume loss. Mild patchy white matter hypoattenuation is also noted consistent with chronic microvascular ischemic change. Vascular: No hyperdense vessel or unexpected calcification. There is a small amount of air adjacent  to the cavernous portions of the intracranial internal carotid arteries. Air is seen adjacent to the orbital apices. A trace amount of air is seen below the skull base below the horizontal portion of the carotid canal and in the left temporal region. Skull: No fracture or skull lesion. No visualized skull base fracture defect to account the small amount skull base air. Sinuses/Orbits: Visualize globes and orbits are unremarkable. Visualized sinuses and mastoid air cells are clear. Other: None. IMPRESSION: 1. No acute intracranial abnormalities. 2. Age-appropriate volume loss.  Mild small vessel ischemic change. 3. Small amount of air is seen at the skull base primarily  adjacent to the cavernous carotid arteries. Etiology of this is unclear. No visualized skull base fracture sinus wall defect. Consider follow-up maxillofacial CT for further assessment. Electronically Signed   By: Lajean Manes M.D.   On: 01/22/2019 19:23   Dg Chest Portable 1 View  Result Date: 01/22/2019 CLINICAL DATA:  Weakness with altered mental status. EXAM: PORTABLE CHEST 1 VIEW COMPARISON:  08/12/2014 FINDINGS: 1822 hours. Lungs are hyperexpanded. Interstitial markings are diffusely coarsened with chronic features. The lungs are clear without focal pneumonia, edema, pneumothorax or pleural effusion. The cardio pericardial silhouette is enlarged. Large hiatal hernia evident. Bones are diffusely demineralized. Telemetry leads overlie the chest. IMPRESSION: 1. Cardiomegaly with underlying chronic interstitial changes. No acute cardiopulmonary findings. 2. Large hiatal hernia. Electronically Signed   By: Misty Stanley M.D.   On: 01/22/2019 18:43       TELEMETRY: sinus rhythm, 70 bpm  ASSESSMENT AND PLAN:  Active Problems:   Bradycardia    1. Sinus bradycardia, resolved after holding metoprolol. Heart rate in the 60-70s at rest.  2. Urinary tract infection 3. Alzheimer's dementia 4. Hypertension, on HCTZ, losartan  Recommendations: 1. Continue to metoprolol 2. Defer temporary or permanent pacemaker   Sign off for now; call for any questions.   Clabe Seal, PA-C 01/24/2019 12:19 PM

## 2019-01-24 NOTE — Evaluation (Signed)
Clinical/Bedside Swallow Evaluation Patient Details  Name: Paige Vasquez MRN: [id number] Date of Birth: Jul 30, 1927  Today's Date: 01/24/2019 Time: SLP Start Time (ACUTE ONLY): 67 SLP Stop Time (ACUTE ONLY): 1325 SLP Time Calculation (min) (ACUTE ONLY): 45 min  Past Medical History:  Past Medical History:  Diagnosis Date  . Cystitis   . GERD (gastroesophageal reflux disease)   . HTN (hypertension)   . RLS (restless legs syndrome)    Past Surgical History:  Past Surgical History:  Procedure Laterality Date  . ABDOMINAL HYSTERECTOMY    . NASAL SINUS SURGERY     HPI:  Pt is a 85 y.o. female with a known history of advanced Dementia, GERD, Large hiatal hernia(01/22/2019), hypertension, restless leg syndrome comes to the emergency room with daughter. Patient has Alzheimer's dementia which has been progressively declining for last six months according to the daughter. Patient has been quite difficult at home and lives with her husband. She at times becomes suspicious about her husband and does not listen to him. Today she went to the neighbors house to call on cops and asked her husband to leave the home.  Pt has been eating a regular diet at home w/out noted incidents per Dtr; both pt and Dtr stated pt does "get full easily"; Dtr was unaware of pt's Large Hiatal Hernia. Briefly discussed the impact of such on Esophageal motility and desire for more oral intake at times; Dtr stated she sees this behavior at home. This CXR revealed Lungs Clear.    Assessment / Plan / Recommendation Clinical Impression  Pt appeared to present w/ adequate oropharyngeal phase swallowing function w/ No overt s/s of aspiration noted; no coughing or throat clearing w/ any po trials consumed. Pt consumed trials of thin liquids via Straw sipping w/ no s/s of aspiration noted; no decline in vocal quality or respiratory status during/post trials. Same was noted per Dtr when eating bites of meat and potato. Oral phase  appeared [facility] for bolus management and oral clearing; timely A-P transfer was noted w/ all trials. Encouraged pt to take her time as she fed herself; Small Sips Slowly as she likes to drink via Straw baseline. OM exam revealed No unilateral weakness or decreased tone. Speech was clear.  Pt does have a baseline dx of a Large Hiatal Hernia which can impact Esophageal Motility and clearing - this may be related to her "full feelings" she has frequently when eating per Dtr's report. Discussed the importance of monitoring this as it can increase risk for Regurgitation and aspiration of Reflux material. Encouraged f/u w/ GI for further assessment and education. Dtr stated pt already eats small meals more frequently during the day and drinks Ensure/Boost.  Recommend a more Regular/Mech Soft consistency(cooked foods cut small - especially meats) diet w/ Thin liquids. Recommend general aspiration precautions; REFLUX precautions. Pills in Puree for easier swallowing. Pt does have a baseline of Dementia and may benefit from Dietician consult and f/u for support; also reduced distractions during meals. No further skilled ST Services indicated at this time. Pt does have a baseline of Large Hiatal Hernia and Dementia and may need monitoring for any decline from previous status as she transitions to a next level of care. NSG updated. Dtr agreed.  SLP Visit Diagnosis: Dysphagia, unspecified (R13.10)(Esophageal phase dysmotility suspected)    Aspiration Risk  (reduced following general precautions)    Diet Recommendation  Regular/Mech Soft diet consistency - MEATS CUT WELL, Small pieces of Foods; Thin liquids. REFLUX Precautions; general  aspiration precautions including reducing distractions at meals secondary to baseline Dementia  Medication Administration: Whole meds with puree(she does this baseline for easier swallowing)    Other  Recommendations Recommended Consults: Consider GI evaluation;Consider esophageal  assessment(Dietician f/u) Oral Care Recommendations: Oral care BID;Staff/trained caregiver to provide oral care Other Recommendations: (n/a)   Follow up Recommendations None      Frequency and Duration (n/a)  (n/a)       Prognosis Prognosis for Safe Diet Advancement: Fair(-Good) Barriers to Reach Goals: Cognitive deficits;Severity of deficits      Swallow Study   General Date of Onset: 01/22/19 HPI: Pt is a 85 y.o. female with a known history of advanced Dementia, GERD, Large hiatal hernia(01/22/2019), hypertension, restless leg syndrome comes to the emergency room with daughter. Patient has Alzheimer's dementia which has been progressively declining for last six months according to the daughter. Patient has been quite difficult at home and lives with her husband. She at times becomes suspicious about her husband and does not listen to him. Today she went to the neighbors house to call on cops and asked her husband to leave the home.  Pt has been eating a regular diet at home w/out noted incidents per Dtr; both pt and Dtr stated pt does "get full easily"; Dtr was unaware of pt's Large Hiatal Hernia. Briefly discussed the impact of such on Esophageal motility and desire for more oral intake at times; Dtr stated she sees this behavior at home. This CXR revealed Lungs Clear.  Type of Study: Bedside Swallow Evaluation Previous Swallow Assessment: none reported Diet Prior to this Study: Regular;Thin liquids Temperature Spikes Noted: No(wbc 9.7) Respiratory Status: Room air History of Recent Intubation: No Behavior/Cognition: Alert;Confused;Pleasant mood;Cooperative;Distractible;Requires cueing(baseline Dementia) Oral Cavity Assessment: Within Functional Limits Oral Care Completed by SLP: Recent completion by staff Oral Cavity - Dentition: Adequate natural dentition Vision: Functional for self-feeding Self-Feeding Abilities: Able to feed self;Needs set up Patient Positioning: Upright in  chair Baseline Vocal Quality: Normal Volitional Cough: Strong Volitional Swallow: Able to elicit    Oral/Motor/Sensory Function Overall Oral Motor/Sensory Function: Within functional limits(and w/ all bolus management, clearing)   Ice Chips Ice chips: Not tested   Thin Liquid Thin Liquid: Within functional limits Presentation: Self Fed;Straw(5 trials) Other Comments: Dtr denied any coughing during the part of meal she was present for    Nectar Thick Nectar Thick Liquid: Not tested   Honey Thick Honey Thick Liquid: Not tested   Puree Puree: Not tested   Solid     Solid: Within functional limits Presentation: Self Fed;Spoon(few bites of meat and baked potatoe) Other Comments: Dtr and pt reported no deficits w/ chewing and swallowing - pt stated "I'm just full" and did not want anymore        Orinda Kenner, MS, CCC-SLP , 01/24/2019,4:26 PM

## 2019-01-24 NOTE — Progress Notes (Signed)
[facility] NAME: Paige Vasquez    MR#:  [id number]  DATE OF BIRTH:  1927/09/13  SUBJECTIVE:  Patient with dementia at baseline, family interested in placement, case management/social services working on disposition planning, cardiology input appreciated  REVIEW OF SYSTEMS:  CONSTITUTIONAL: No fever, fatigue or weakness.  EYES: No blurred or double vision.  EARS, NOSE, AND THROAT: No tinnitus or ear pain.  RESPIRATORY: No cough, shortness of breath, wheezing or hemoptysis.  CARDIOVASCULAR: No chest pain, orthopnea, edema.  GASTROINTESTINAL: No nausea, vomiting, diarrhea or abdominal pain.  GENITOURINARY: No dysuria, hematuria.  ENDOCRINE: No polyuria, nocturia,  HEMATOLOGY: No anemia, easy bruising or bleeding SKIN: No rash or lesion. MUSCULOSKELETAL: No joint pain or arthritis.   NEUROLOGIC: No tingling, numbness, weakness.  PSYCHIATRY: No anxiety or depression.   ROS  DRUG ALLERGIES:  No Known Allergies  VITALS:  Blood pressure 90/61, pulse (!) 58, temperature (!) 97.5 F (36.4 C), temperature source Oral, resp. rate 15, height 5\' 3"  (1.6 m), weight 54.4 kg, SpO2 97 %.  PHYSICAL EXAMINATION:  GENERAL:  85 y.o.-year-old patient lying in the bed with no acute distress.  EYES: Pupils equal, round, reactive to light and accommodation. No scleral icterus. Extraocular muscles intact.  HEENT: Head atraumatic, normocephalic. Oropharynx and nasopharynx clear.  NECK:  Supple, no jugular venous distention. No thyroid enlargement, no tenderness.  LUNGS: Normal breath sounds bilaterally, no wheezing, rales,rhonchi or crepitation. No use of accessory muscles of respiration.  CARDIOVASCULAR: S1, S2 normal. No murmurs, rubs, or gallops.  ABDOMEN: Soft, nontender, nondistended. Bowel sounds present. No organomegaly or mass.  EXTREMITIES: No pedal edema, cyanosis, or clubbing.  NEUROLOGIC: Cranial nerves II through XII are intact. Muscle  strength 5/5 in all extremities. Sensation intact. Gait not checked.  PSYCHIATRIC: The patient is alert and oriented x 3.  SKIN: No obvious rash, lesion, or ulcer.   Physical Exam LABORATORY PANEL:   CBC Recent Labs  Lab 01/22/19 1714  WBC 9.7  HGB 14.2  HCT 43.0  PLT 227   ------------------------------------------------------------------------------------------------------------------  Chemistries  Recent Labs  Lab 01/22/19 1714 01/22/19 1821  NA 136  --   K 3.7  --   CL 102  --   CO2 28  --   GLUCOSE 102*  --   BUN 22  --   CREATININE 0.81  --   CALCIUM 9.0  --   MG  --  2.1  AST 26  --   ALT 20  --   ALKPHOS 68  --   BILITOT 0.6  --    ------------------------------------------------------------------------------------------------------------------  Cardiac Enzymes Recent Labs  Lab 01/22/19 1821  TROPONINI <0.03   ------------------------------------------------------------------------------------------------------------------  RADIOLOGY:  Ct Head Wo Contrast  Result Date: 01/22/2019 CLINICAL DATA:  Here for acute changes over last 2 weeks in mental status. Has dementia and has begun to not recognize husband and think a stranger was in her home. Did see PCP this week but changes were not brought up. Today daughter reports pt went to neighbors to have cops called because thought husband broke into home. EXAM: CT HEAD WITHOUT CONTRAST TECHNIQUE: Contiguous axial images were obtained from the base of the skull through the vertex without intravenous contrast. COMPARISON:  09/30/2006 FINDINGS: Brain: No evidence of acute infarction, hemorrhage, hydrocephalus, extra-axial collection or mass lesion/mass effect. There is ventricular and sulcal enlargement reflecting age-appropriate volume loss. Mild patchy white matter hypoattenuation is also noted consistent with chronic microvascular ischemic change.  Vascular: No hyperdense vessel or unexpected calcification. There is a  small amount of air adjacent to the cavernous portions of the intracranial internal carotid arteries. Air is seen adjacent to the orbital apices. A trace amount of air is seen below the skull base below the horizontal portion of the carotid canal and in the left temporal region. Skull: No fracture or skull lesion. No visualized skull base fracture defect to account the small amount skull base air. Sinuses/Orbits: Visualize globes and orbits are unremarkable. Visualized sinuses and mastoid air cells are clear. Other: None. IMPRESSION: 1. No acute intracranial abnormalities. 2. Age-appropriate volume loss.  Mild small vessel ischemic change. 3. Small amount of air is seen at the skull base primarily adjacent to the cavernous carotid arteries. Etiology of this is unclear. No visualized skull base fracture sinus wall defect. Consider follow-up maxillofacial CT for further assessment. Electronically Signed   By: Lajean Manes M.D.   On: 01/22/2019 19:23   Dg Chest Portable 1 View  Result Date: 01/22/2019 CLINICAL DATA:  Weakness with altered mental status. EXAM: PORTABLE CHEST 1 VIEW COMPARISON:  08/12/2014 FINDINGS: 1822 hours. Lungs are hyperexpanded. Interstitial markings are diffusely coarsened with chronic features. The lungs are clear without focal pneumonia, edema, pneumothorax or pleural effusion. The cardio pericardial silhouette is enlarged. Large hiatal hernia evident. Bones are diffusely demineralized. Telemetry leads overlie the chest. IMPRESSION: 1. Cardiomegaly with underlying chronic interstitial changes. No acute cardiopulmonary findings. 2. Large hiatal hernia. Electronically Signed   By: Misty Stanley M.D.   On: 01/22/2019 18:43    ASSESSMENT AND PLAN:  Paige Vasquez a89 y.o.femalewith a known history of advanced dementia, hypertension, restless leg syndrome comes to the emergency room with daughter. Patient has Alzheimer's dementia which has been progressively declining for last six  months according to the daughte  *Acute sinus bradycardia Resolving Suspect due to beta-blocker therapy Continue to hold beta-blocker, cardiology input appreciated  *Acute UTI Resolving Continue empiric Rocephin for 3-day course, urine culture noted for contamination  *Chronic Advanced/progressive dementia with intermittent aggressive behavior Stable D/W patient's daughters at length, husband wants patient at home however has been difficult to manage, social work/case management assisting with disposition planning  *Chronic hypertension  Stable on current regiment   DVT prophylaxis-subcu Lovenox  Discharge to home with services versus skilled nursing facility in 1 to 2 days barring any complications  All the records are reviewed and case discussed with Care Management/Social Workerr. Management plans discussed with the patient, family and they are in agreement.  CODE STATUS: dnr  TOTAL TIME TAKING CARE OF THIS PATIENT: 35 minutes.     POSSIBLE D/C IN 1-2 DAYS, DEPENDING ON CLINICAL CONDITION.   Paige Vasquez  M.D on 01/24/2019   Between 7am to 6pm - Pager - [phone]  After 6pm go to [email or web address] - password [email or web address] [facility]  Office  [phone]  CC: Primary care physician; Rusty Aus, MD  Note: This dictation was prepared with Dragon dictation along with smaller phrase technology. Any transcriptional errors that result from this process are unintentional.

## 2019-01-24 NOTE — Progress Notes (Signed)
[facility] NAME: Paige Vasquez    MR#:  [id number]  DATE OF BIRTH:  June 19, 1927  SUBJECTIVE:  CHIEF COMPLAINT:   Chief Complaint  Patient presents with  . Altered Mental Status    REVIEW OF SYSTEMS:  CONSTITUTIONAL: No fever, fatigue or weakness.  EYES: No blurred or double vision.  EARS, NOSE, AND THROAT: No tinnitus or ear pain.  RESPIRATORY: No cough, shortness of breath, wheezing or hemoptysis.  CARDIOVASCULAR: No chest pain, orthopnea, edema.  GASTROINTESTINAL: No nausea, vomiting, diarrhea or abdominal pain.  GENITOURINARY: No dysuria, hematuria.  ENDOCRINE: No polyuria, nocturia,  HEMATOLOGY: No anemia, easy bruising or bleeding SKIN: No rash or lesion. MUSCULOSKELETAL: No joint pain or arthritis.   NEUROLOGIC: No tingling, numbness, weakness.  PSYCHIATRY: No anxiety or depression.   ROS  DRUG ALLERGIES:  No Known Allergies  VITALS:  Blood pressure 90/61, pulse (!) 58, temperature (!) 97.5 F (36.4 C), temperature source Oral, resp. rate 15, height 5\' 3"  (1.6 m), weight 54.4 kg, SpO2 97 %.  PHYSICAL EXAMINATION:  GENERAL:  85 y.o.-year-old patient lying in the bed with no acute distress.  EYES: Pupils equal, round, reactive to light and accommodation. No scleral icterus. Extraocular muscles intact.  HEENT: Head atraumatic, normocephalic. Oropharynx and nasopharynx clear.  NECK:  Supple, no jugular venous distention. No thyroid enlargement, no tenderness.  LUNGS: Normal breath sounds bilaterally, no wheezing, rales,rhonchi or crepitation. No use of accessory muscles of respiration.  CARDIOVASCULAR: S1, S2 normal. No murmurs, rubs, or gallops.  ABDOMEN: Soft, nontender, nondistended. Bowel sounds present. No organomegaly or mass.  EXTREMITIES: No pedal edema, cyanosis, or clubbing.  NEUROLOGIC: Cranial nerves II through XII are intact. Muscle strength 5/5 in all extremities. Sensation intact. Gait not checked.   PSYCHIATRIC: The patient is alert and oriented x 3.  SKIN: No obvious rash, lesion, or ulcer.   Physical Exam LABORATORY PANEL:   CBC Recent Labs  Lab 01/22/19 1714  WBC 9.7  HGB 14.2  HCT 43.0  PLT 227   ------------------------------------------------------------------------------------------------------------------  Chemistries  Recent Labs  Lab 01/22/19 1714 01/22/19 1821  NA 136  --   K 3.7  --   CL 102  --   CO2 28  --   GLUCOSE 102*  --   BUN 22  --   CREATININE 0.81  --   CALCIUM 9.0  --   MG  --  2.1  AST 26  --   ALT 20  --   ALKPHOS 68  --   BILITOT 0.6  --    ------------------------------------------------------------------------------------------------------------------  Cardiac Enzymes Recent Labs  Lab 01/22/19 1821  TROPONINI <0.03   ------------------------------------------------------------------------------------------------------------------  RADIOLOGY:  Ct Head Wo Contrast  Result Date: 01/22/2019 CLINICAL DATA:  Here for acute changes over last 2 weeks in mental status. Has dementia and has begun to not recognize husband and think a stranger was in her home. Did see PCP this week but changes were not brought up. Today daughter reports pt went to neighbors to have cops called because thought husband broke into home. EXAM: CT HEAD WITHOUT CONTRAST TECHNIQUE: Contiguous axial images were obtained from the base of the skull through the vertex without intravenous contrast. COMPARISON:  09/30/2006 FINDINGS: Brain: No evidence of acute infarction, hemorrhage, hydrocephalus, extra-axial collection or mass lesion/mass effect. There is ventricular and sulcal enlargement reflecting age-appropriate volume loss. Mild patchy white matter hypoattenuation is also noted consistent with chronic microvascular ischemic change. Vascular: No  hyperdense vessel or unexpected calcification. There is a small amount of air adjacent to the cavernous portions of the  intracranial internal carotid arteries. Air is seen adjacent to the orbital apices. A trace amount of air is seen below the skull base below the horizontal portion of the carotid canal and in the left temporal region. Skull: No fracture or skull lesion. No visualized skull base fracture defect to account the small amount skull base air. Sinuses/Orbits: Visualize globes and orbits are unremarkable. Visualized sinuses and mastoid air cells are clear. Other: None. IMPRESSION: 1. No acute intracranial abnormalities. 2. Age-appropriate volume loss.  Mild small vessel ischemic change. 3. Small amount of air is seen at the skull base primarily adjacent to the cavernous carotid arteries. Etiology of this is unclear. No visualized skull base fracture sinus wall defect. Consider follow-up maxillofacial CT for further assessment. Electronically Signed   By: Lajean Manes M.D.   On: 01/22/2019 19:23   Dg Chest Portable 1 View  Result Date: 01/22/2019 CLINICAL DATA:  Weakness with altered mental status. EXAM: PORTABLE CHEST 1 VIEW COMPARISON:  08/12/2014 FINDINGS: 1822 hours. Lungs are hyperexpanded. Interstitial markings are diffusely coarsened with chronic features. The lungs are clear without focal pneumonia, edema, pneumothorax or pleural effusion. The cardio pericardial silhouette is enlarged. Large hiatal hernia evident. Bones are diffusely demineralized. Telemetry leads overlie the chest. IMPRESSION: 1. Cardiomegaly with underlying chronic interstitial changes. No acute cardiopulmonary findings. 2. Large hiatal hernia. Electronically Signed   By: Misty Stanley M.D.   On: 01/22/2019 18:43    ASSESSMENT AND PLAN:  [name] a89 y.o.femalewith a known history of advanced dementia, hypertension, restless leg syndrome comes to the emergency room with daughter. Patient has Alzheimer's dementia which has been progressively declining for last six months according to the daughte  *Acute sinus  bradycardia Suspect due to beta-blocker therapy Continue to hold beta-blocker, cardiology to see  *Acute UTI Stable Continue empiric Rocephin and follow-up on cultures  *Chronic Advanced/progressive dementia with intermittent aggressive behavior Stable D/W patient's daughter at length. Husband wants patient at home however has been difficult to manage patient at home per daughter, social work/case management to assist with disposition planning  *Chronic hypertension  Stable on current regiment   DVT prophylaxis-subcu Lovenox  All the records are reviewed and case discussed with Care Management/Social Workerr. Management plans discussed with the patient, family and they are in agreement.  CODE STATUS: dnr  TOTAL TIME TAKING CARE OF THIS PATIENT: 35 minutes.     POSSIBLE D/C IN 1-2 DAYS, DEPENDING ON CLINICAL CONDITION.   Avel Peace  M.D on 01/24/2019   Between 7am to 6pm - Pager - [phone]  After 6pm go to [email or web address] - password [email or web address] [facility]  Office  [phone]  CC: Primary care physician; Rusty Aus, MD  Note: This dictation was prepared with Dragon dictation along with smaller phrase technology. Any transcriptional errors that result from this process are unintentional.

## 2019-01-24 NOTE — Clinical Social Work Note (Signed)
CSW was informed that patient's family would like her to go to memory care or SNF.  CSW spoke to patient's daughter Paige Vasquez, [phone], and she said her sister researched and they would like [facility], [facility], or [facility].  CSW informed her that CSW would have to make referral to see if they have any spots available.  CSW contacted [facility], they do not have any memory care beds available, [name] did not answer, left a message, and also waiting to hear back from [name].  CSW will work patient up for SNF placement in case family wants to try to pursue SNF placement.  CSW to continue to follow patient's progress throughout discharge planning.  Jones Broom. [place], MSW, [place]  01/24/2019 5:30 PM

## 2019-01-25 ENCOUNTER — Inpatient Hospital Stay: Payer: Medicare Other

## 2019-01-25 ENCOUNTER — Other Ambulatory Visit: Payer: Self-pay

## 2019-01-25 LAB — CBC
HCT: 47.2 % — ABNORMAL HIGH (ref 36.0–46.0)
Hemoglobin: 15.7 g/dL — ABNORMAL HIGH (ref 12.0–15.0)
MCH: 29 pg (ref 26.0–34.0)
MCHC: 33.3 g/dL (ref 30.0–36.0)
MCV: 87.2 fL (ref 80.0–100.0)
Platelets: 238 10*3/uL (ref 150–400)
RBC: 5.41 MIL/uL — AB (ref 3.87–5.11)
RDW: 12.8 % (ref 11.5–15.5)
WBC: 10.6 10*3/uL — ABNORMAL HIGH (ref 4.0–10.5)
nRBC: 0 % (ref 0.0–0.2)

## 2019-01-25 MED ORDER — ACETAMINOPHEN 325 MG PO TABS
650.0000 mg | ORAL_TABLET | Freq: Once | ORAL | Status: AC
  Administered 2019-01-25: 650 mg via ORAL
  Filled 2019-01-25: qty 2

## 2019-01-25 MED ORDER — ALUM & MAG HYDROXIDE-SIMETH 200-200-20 MG/5ML PO SUSP
30.0000 mL | Freq: Once | ORAL | Status: AC
  Administered 2019-01-25: 30 mL via ORAL
  Filled 2019-01-25: qty 30

## 2019-01-25 MED ORDER — CALCIUM CARBONATE ANTACID 500 MG PO CHEW
1.0000 | CHEWABLE_TABLET | Freq: Once | ORAL | Status: AC
  Administered 2019-01-25: 200 mg via ORAL
  Filled 2019-01-25: qty 1

## 2019-01-25 MED ORDER — LIDOCAINE VISCOUS HCL 2 % MT SOLN
15.0000 mL | Freq: Once | OROMUCOSAL | Status: AC
  Administered 2019-01-25: 15 mL via ORAL
  Filled 2019-01-25: qty 15

## 2019-01-25 NOTE — Progress Notes (Deleted)
CCMD called to report pt in a fib/ HR 120s/ RN to bedside to assess / pt asymptomatic / Dr. Anselm Jungling made aware/ will continue to monitor.

## 2019-01-25 NOTE — Progress Notes (Signed)
Physical Therapy Treatment Patient Details Name: Paige Vasquez MRN: [id number] DOB: 05-19-1927 Today's Date: 01/25/2019    History of Present Illness pt is a 85 y.o F addmitted on 01/22/2019 with dx of bradycardia. history of advanced dementia, hypertension, restless leg syndrome. In the ED pt was dx with a UTI and heart rate was in the 30's-40's.     PT Comments    Pt presents with deficits in strength, transfers, mobility, gait, balance, and activity tolerance.  Pt tolerated below therex and amb with HR and SpO2 WNL.  Pt ambulated with slow cadence with short B step length but steady without LOB.  Pt's amb distance and the session in general was limited secondary to nausea after walking, nursing notified.  Pt will benefit from HHPT services upon discharge to safely address above deficits for decreased caregiver assistance and eventual return to PLOF.       Follow Up Recommendations  Home health PT     Equipment Recommendations  None recommended by PT    Recommendations for Other Services       Precautions / Restrictions Precautions Precautions: Fall Restrictions Weight Bearing Restrictions: No    Mobility  Bed Mobility               General bed mobility comments: NT, pt in recliner  Transfers Overall transfer level: Needs assistance Equipment used: Rolling walker (2 wheeled) Transfers: Sit to/from Stand Sit to Stand: Min guard         General transfer comment: Extra effort and use of BUEs required during transfers but no physical assistance needed   Ambulation/Gait Ambulation/Gait assistance: Min guard Gait Distance (Feet): 40 Feet Assistive device: Rolling walker (2 wheeled) Gait Pattern/deviations: Step-through pattern;Decreased step length - right;Decreased step length - left;Trunk flexed     General Gait Details: Slow cadence with short B step length but steady without LOB with distance limited secondary to nausea with walking, nursing  notified   Stairs             Wheelchair Mobility    Modified Rankin (Stroke Patients Only)       Balance Overall balance assessment: Mild deficits observed, not formally tested                                          Cognition Arousal/Alertness: Awake/alert Behavior During Therapy: WFL for tasks assessed/performed                                   General Comments: A&O to person and place      Exercises Total Joint Exercises Ankle Circles/Pumps: Strengthening;Both;10 reps Quad Sets: Strengthening;Both;10 reps Heel Slides: AROM;Both;10 reps Long Arc Quad: Strengthening;Both;10 reps Knee Flexion: Strengthening;Both;10 reps Marching in Standing: AROM;Both;10 reps;Seated    General Comments        Pertinent Vitals/Pain Pain Assessment: No/denies pain    Home Living                      Prior Function            PT Goals (current goals can now be found in the care plan section) Progress towards PT goals: Progressing toward goals    Frequency    Min 2X/week      PT Plan Current plan remains appropriate  Co-evaluation              AM-PAC PT "6 Clicks" Mobility   Outcome Measure  Help needed turning from your back to your side while in a flat bed without using bedrails?: A Little Help needed moving from lying on your back to sitting on the side of a flat bed without using bedrails?: A Little Help needed moving to and from a bed to a chair (including a wheelchair)?: A Little Help needed standing up from a chair using your arms (e.g., wheelchair or bedside chair)?: A Little Help needed to walk in hospital room?: A Little Help needed climbing 3-5 steps with a railing? : A Lot 6 Click Score: 17    End of Session Equipment Utilized During Treatment: Gait belt Activity Tolerance: Other (comment)(Limited by nausea) Patient left: in chair;with chair alarm set;with family/visitor present;with call  bell/phone within reach Nurse Communication: Mobility status;Other (comment)(Pt c/o nausea with amb) PT Visit Diagnosis: Unsteadiness on feet (R26.81);History of falling (Z91.81);Ataxic gait (R26.0);Pain;Difficulty in walking, not elsewhere classified (R26.2);Muscle weakness (generalized) (M62.81)     Time: 2440-1027 PT Time Calculation (min) (ACUTE ONLY): 16 min  Charges:  $Therapeutic Exercise: 8-22 mins                     D. Scott Elaya Droege PT, DPT 01/25/19, 1:57 PM

## 2019-01-25 NOTE — Care Management Important Message (Signed)
Copy of signed Medicare IM left with patient in room. 

## 2019-01-25 NOTE — Progress Notes (Addendum)
[place] [facility] [place] NAME: Paige Vasquez    MR#:  [id number]  DATE OF BIRTH:  Sep 15, 1927  SUBJECTIVE:  Patient with dementia at baseline, family interested in placement, case management/social services working on disposition planning, cardiology input appreciated, complaining of left upper chest wall discomfort/indigestion-difficult to obtain details due to dementia  REVIEW OF SYSTEMS:  CONSTITUTIONAL: No fever, fatigue or weakness.  EYES: No blurred or double vision.  EARS, NOSE, AND THROAT: No tinnitus or ear pain.  RESPIRATORY: No cough, shortness of breath, wheezing or hemoptysis.  CARDIOVASCULAR: No chest pain, orthopnea, edema.  GASTROINTESTINAL: No nausea, vomiting, diarrhea or abdominal pain.  GENITOURINARY: No dysuria, hematuria.  ENDOCRINE: No polyuria, nocturia,  HEMATOLOGY: No anemia, easy bruising or bleeding SKIN: No rash or lesion. MUSCULOSKELETAL: No joint pain or arthritis.   NEUROLOGIC: No tingling, numbness, weakness.  PSYCHIATRY: No anxiety or depression.   ROS  DRUG ALLERGIES:  No Known Allergies  VITALS:  Blood pressure 140/60, pulse 73, temperature 98.5 F (36.9 C), temperature source Oral, resp. rate 18, height 5\' 3"  (1.6 m), weight 54.4 kg, SpO2 94 %.  PHYSICAL EXAMINATION:  GENERAL:  85 y.o.-year-old patient lying in the bed with no acute distress.  EYES: Pupils equal, round, reactive to light and accommodation. No scleral icterus. Extraocular muscles intact.  HEENT: Head atraumatic, normocephalic. Oropharynx and nasopharynx clear.  NECK:  Supple, no jugular venous distention. No thyroid enlargement, no tenderness.  LUNGS: Normal breath sounds bilaterally, no wheezing, rales,rhonchi or crepitation. No use of accessory muscles of respiration.  CARDIOVASCULAR: S1, S2 normal. No murmurs, rubs, or gallops.  ABDOMEN: Soft, nontender, nondistended. Bowel sounds present. No organomegaly or mass.  EXTREMITIES: No  pedal edema, cyanosis, or clubbing.  NEUROLOGIC: Cranial nerves II through XII are intact. Muscle strength 5/5 in all extremities. Sensation intact. Gait not checked.  PSYCHIATRIC: The patient is alert and oriented x 3.  SKIN: No obvious rash, lesion, or ulcer.   Physical Exam LABORATORY PANEL:   CBC Recent Labs  Lab 01/22/19 1714  WBC 9.7  HGB 14.2  HCT 43.0  PLT 227   ------------------------------------------------------------------------------------------------------------------  Chemistries  Recent Labs  Lab 01/22/19 1714 01/22/19 1821  NA 136  --   K 3.7  --   CL 102  --   CO2 28  --   GLUCOSE 102*  --   BUN 22  --   CREATININE 0.81  --   CALCIUM 9.0  --   MG  --  2.1  AST 26  --   ALT 20  --   ALKPHOS 68  --   BILITOT 0.6  --    ------------------------------------------------------------------------------------------------------------------  Cardiac Enzymes Recent Labs  Lab 01/22/19 1821  TROPONINI <0.03   ------------------------------------------------------------------------------------------------------------------  RADIOLOGY:  No results found.  ASSESSMENT AND PLAN:  [name] a85 y.o.femalewith a known history of advanced dementia, hypertension, restless leg syndrome comes to the emergency room with daughter. Patient has Alzheimer's dementia which has been progressively declining for last six months according to the daughte  *Acute sinus bradycardia Resolving Suspect due to beta-blocker therapy Continue to hold beta-blocker, cardiology input appreciated  *Acute UTI Resolving Continue empiric Rocephin for 3-day course, urine culture noted for contamination  *Acute upper right chest discomfort Difficult interview due to dementia Will give GI cocktail x1, check EKG, 2 view chest x-ray, Tylenol p.o. x1 No evidence for ACS clinically  *Chronic Advanced/progressive dementia with intermittent aggressive behavior Stable D/W  patient's daughters at  length, husband wants patient at home however has been difficult to manage, social work/case management assisting with disposition planning  *Chronic hypertension  Stable on current regiment   DVT prophylaxis-subcu Lovenox  Discharge to home with services versus skilled nursing facility in 1 to 2 days barring any complications  All the records are reviewed and case discussed with Care Management/Social Workerr. Management plans discussed with the patient, family and they are in agreement.  CODE STATUS: dnr  TOTAL TIME TAKING CARE OF THIS PATIENT: 35 minutes.     POSSIBLE D/C IN 1-2 DAYS, DEPENDING ON CLINICAL CONDITION.   Avel Peace Salary M.D on 01/25/2019   Between 7am to 6pm - Pager - [phone]  After 6pm go to [email or web address] - password [email or web address] [facility]  Office  [phone]  CC: Primary care physician; Rusty Aus, MD  Note: This dictation was prepared with Dragon dictation along with smaller phrase technology. Any transcriptional errors that result from this process are unintentional.

## 2019-01-25 NOTE — NC FL2 (Signed)
[place] LEVEL OF CARE SCREENING TOOL     IDENTIFICATION  Patient Name: Paige Vasquez Birthdate: December 15, 1927 Sex: female Admission Date (Current Location): 01/22/2019  [place] and [place] Number:  Engineering geologist and Address:  [facility], [address], [place], [place] [address]      Provider Number: [id number]  Attending Physician Name and Address:  Gorden Harms, MD  Relative Name and Phone Number:  Suzi Roots Daughter   [phone] or SHAMELL, HITTLE  [phone] or KYERRA, VARGO  [phone]     Current Level of Care: Hospital Recommended Level of Care: [place] Prior Approval Number:    Date Approved/Denied:   PASRR Number: [id number]  Discharge Plan: SNF    Current Diagnoses: Patient Active Problem List   Diagnosis Date Noted  . Bradycardia 01/22/2019  . UTI (urinary tract infection) 07/02/2018  . Falls 07/02/2018  . HTN (hypertension) 07/02/2018  . GERD (gastroesophageal reflux disease) 07/02/2018  . RLS (restless legs syndrome) 07/02/2018    Orientation RESPIRATION BLADDER Height & Weight     Self, Time, Situation, Place  Normal Continent Weight: 120 lb (54.4 kg) Height:  5\' 3"  (160 cm)  BEHAVIORAL SYMPTOMS/MOOD NEUROLOGICAL BOWEL NUTRITION STATUS      Continent Diet  AMBULATORY STATUS COMMUNICATION OF NEEDS Skin   Limited Assist Verbally Normal                       Personal Care Assistance Level of Assistance  Bathing, Feeding, Dressing Bathing Assistance: Limited assistance Feeding assistance: Independent Dressing Assistance: Limited assistance     Functional Limitations Info  Sight, Hearing, Speech Sight Info: Adequate Hearing Info: Adequate Speech Info: Adequate    SPECIAL CARE FACTORS FREQUENCY  PT (By licensed PT)     PT Frequency: 5x a week              Contractures      Additional Factors Info  Code Status, Allergies Code Status Info: DNR Allergies  Info: NKA           Current Medications (01/25/2019):  This is the current hospital active medication list Current Facility-Administered Medications  Medication Dose Route Frequency Provider Last Rate Last Dose  . acetaminophen (TYLENOL) tablet 650 mg  650 mg Oral Q6H PRN Fritzi Mandes, MD   650 mg at 01/23/19 1550   Or  . acetaminophen (TYLENOL) suppository 650 mg  650 mg Rectal Q6H PRN Fritzi Mandes, MD      . ALPRAZolam Duanne Moron) tablet 0.25 mg  0.25 mg Oral TID PRN Fritzi Mandes, MD   0.25 mg at 01/25/19 1308  . cholecalciferol (VITAMIN D) tablet 1,000 Units  1,000 Units Oral Daily Fritzi Mandes, MD   1,000 Units at 01/25/19 1012  . enoxaparin (LOVENOX) injection 40 mg  40 mg Subcutaneous Q24H Fritzi Mandes, MD      . FLUoxetine (PROZAC) capsule 10 mg  10 mg Oral Daily Fritzi Mandes, MD   10 mg at 01/25/19 1012  . hydrALAZINE (APRESOLINE) injection 10 mg  10 mg Intravenous Q6H PRN Fritzi Mandes, MD      . hydrochlorothiazide (MICROZIDE) capsule 12.5 mg  12.5 mg Oral Daily Paraschos, Alexander, MD   12.5 mg at 01/25/19 1012  . losartan (COZAAR) tablet 50 mg  50 mg Oral Daily Paraschos, Alexander, MD   50 mg at 01/25/19 1012  . multivitamin-lutein (OCUVITE-LUTEIN) capsule 2 capsule  2 capsule Oral Daily Fritzi Mandes, MD   2  capsule at 01/25/19 1012  . polyethylene glycol (MIRALAX / GLYCOLAX) packet 17 g  17 g Oral Daily PRN Fritzi Mandes, MD      . pramipexole (MIRAPEX) tablet 0.25 mg  0.25 mg Oral BID Fritzi Mandes, MD   0.25 mg at 01/25/19 1012  . traMADol (ULTRAM) tablet 100 mg  100 mg Oral BID Fritzi Mandes, MD   100 mg at 01/25/19 1012  . vitamin B-12 (CYANOCOBALAMIN) tablet 1,000 mcg  1,000 mcg Oral Daily Fritzi Mandes, MD   1,000 mcg at 01/25/19 1012     Discharge Medications: Please see discharge summary for a list of discharge medications.  Relevant Imaging Results:  Relevant Lab Results:   Additional Information ss: [id number]  Ross Ludwig, LCSWA

## 2019-01-25 NOTE — Clinical Social Work Note (Signed)
CSW spoke with patient's daughter [name] [phone] who was at bedside, and she was updated that CSW is waiting for SNF bed offers.  CSW also updated daughter that [facility] does not have any beds available, and CSW is still waiting to hear back from other ALFs.  Patient's daughter is in agreement to going to SNF if patient is approved, she would like [facility] or [facility], [facility] awaiting for bed offers.  CSW to continue to follow patient's progress throughout discharge planning.  Jones Broom. [place], MSW, [place]  01/25/2019 6:28 PM

## 2019-01-26 MED ORDER — LOSARTAN POTASSIUM 50 MG PO TABS: 50.0000 mg | ORAL_TABLET | Freq: Every day | ORAL | 0 refills | Status: DC

## 2019-01-26 MED ORDER — ALUM & MAG HYDROXIDE-SIMETH 200-200-20 MG/5ML PO SUSP
30.0000 mL | Freq: Once | ORAL | Status: AC
  Administered 2019-01-26: 30 mL via ORAL
  Filled 2019-01-26: qty 30

## 2019-01-26 NOTE — Clinical Social Work Note (Signed)
CSW spoke with patient's husband and daughter they are agreeable to having patient go to SNF at [facility] for short term rehab.  CSW contacted [facility] and they will begin insurance authorization.  CSW to continue to follow patient's progress throughout discharge planning.  Jones Broom. [place], MSW, [place]  01/26/2019 3:57 PM

## 2019-01-26 NOTE — Discharge Summary (Addendum)
[facility] at [place] NAME: Paige Vasquez    MR#:  [id number]  DATE OF BIRTH:  02-Jan-1927  DATE OF ADMISSION:  01/22/2019 ADMITTING PHYSICIAN: Fritzi Mandes, MD  DATE OF DISCHARGE: No discharge date for patient encounter.  PRIMARY CARE PHYSICIAN: Rusty Aus, MD    ADMISSION DIAGNOSIS:  Bradycardia [R00.1] Urinary tract infection without hematuria, site unspecified [N39.0] Altered mental status, unspecified altered mental status type [R41.82]  DISCHARGE DIAGNOSIS:  Active Problems:   Bradycardia   SECONDARY DIAGNOSIS:   Past Medical History:  Diagnosis Date  . Cystitis   . GERD (gastroesophageal reflux disease)   . HTN (hypertension)   . RLS (restless legs syndrome)     HOSPITAL COURSE:  [name] a85 y.o.femalewith a known history of advanced dementia, hypertension, restless leg syndrome comes to the emergency room with daughter. Patient has Alzheimer's dementia which has been progressively declining for last six months according to the daughters  *Acute sinus bradycardia Resolved Secondary to beta-blocker therapy which was discontinued Cardiology to see patient while in house  *Acute UTI Resolved Treated with 3-day course of Rocephin, urine culture noted for contamination  *Acute upper right chest discomfort Resolved Difficult interview due to dementia Treated with GI cocktail, chest x-ray was unimpressive, Tylenol  No evidence for ACS clinically  *ChronicAdvanced/progressive dementia with intermittent aggressive behavior Stable D/Wpatient's daughters at length, husband wants patient at home however has been difficult to manage, social work/case management assisted with disposition planning  *Chronic hypertension Stable on current regiment   DISCHARGE CONDITIONS:   stable  CONSULTS OBTAINED:  Treatment Team:  Fritzi Mandes, MD  DRUG ALLERGIES:  No Known Allergies  DISCHARGE  MEDICATIONS:   Allergies as of 01/26/2019   No Known Allergies     Medication List    STOP taking these medications   metoprolol tartrate 25 MG tablet Commonly known as:  LOPRESSOR     TAKE these medications   acetaminophen 500 MG tablet Commonly known as:  TYLENOL Take 500-1,000 mg by mouth every 6 (six) hours as needed for mild pain or fever.   acetaminophen 325 MG tablet Commonly known as:  TYLENOL Take 2 tablets (650 mg total) by mouth every 6 (six) hours as needed for mild pain (or Fever >/= 101).   ALPRAZolam 0.25 MG tablet Commonly known as:  XANAX Take 1 tablet (0.25 mg total) by mouth 2 (two) times daily as needed for anxiety. What changed:  when to take this   AZO CRANBERRY 250-30 MG Tabs Take 1 tablet by mouth 2 (two) times daily.   cholecalciferol 25 MCG (1000 UT) tablet Commonly known as:  VITAMIN D Take 1,000 Units by mouth daily.   FLUoxetine 10 MG capsule Commonly known as:  PROZAC Take 10 mg by mouth daily.   losartan 50 MG tablet Commonly known as:  COZAAR Take 1 tablet (50 mg total) by mouth daily. Start taking on:  January 27, 2019   pramipexole 0.25 MG tablet Commonly known as:  MIRAPEX Take 0.25 mg by mouth 2 (two) times daily.   PRESERVISION AREDS 2+MULTI VIT Caps Take 2 capsules by mouth daily.   traMADol 50 MG tablet Commonly known as:  ULTRAM Take 1 tablet (50 mg total) by mouth every 6 (six) hours as needed. What changed:   how much to take when to take this   vitamin B-12 1000 MCG tablet Commonly known as:  CYANOCOBALAMIN Take 1,000 mcg by mouth daily.  DISCHARGE INSTRUCTIONS:    If you experience worsening of your admission symptoms, develop shortness of breath, life threatening emergency, suicidal or homicidal thoughts you must seek medical attention immediately by calling 911 or calling your MD immediately  if symptoms less severe.  You Must read complete instructions/literature along with all the possible  adverse reactions/side effects for all the Medicines you take and that have been prescribed to you. Take any new Medicines after you have completely understood and accept all the possible adverse reactions/side effects.   Please note  You were cared for by a hospitalist during your hospital stay. If you have any questions about your discharge medications or the care you received while you were in the hospital after you are discharged, you can call the unit and asked to speak with the hospitalist on call if the hospitalist that took care of you is not available. Once you are discharged, your primary care physician will handle any further medical issues. Please note that NO REFILLS for any discharge medications will be authorized once you are discharged, as it is imperative that you return to your primary care physician (or establish a relationship with a primary care physician if you do not have one) for your aftercare needs so that they can reassess your need for medications and monitor your lab values.    Today   CHIEF COMPLAINT:   Chief Complaint  Patient presents with  . Altered Mental Status    HISTORY OF PRESENT ILLNESS:  85 y.o. female with a known history of advanced dementia, hypertension, restless leg syndrome comes to the emergency room with daughter. Patient has Alzheimer's dementia which has been progressively declining for last six months according to the daughter. Patient has been quite difficult at home and lives with her husband. She at times becomes suspicious about her husband and does not listen to him. Today she went to the neighbors house to call on cops and asked her husband to leave the home.  In the ER patient was found to have UTI. She was also noted to have heart rate upper 30s to 40s. She is on metoprolol which has been held.  She is being admitted for bradycardia, UTI and worsening dementia. VITAL SIGNS:  Blood pressure 116/67, pulse 62, temperature 97.7 F (36.5  C), temperature source Oral, resp. rate 18, height 5\' 3"  (1.6 m), weight 54.4 kg, SpO2 96 %.  I/O:    Intake/Output Summary (Last 24 hours) at 01/26/2019 1236 Last data filed at 01/26/2019 1121 Gross per 24 hour  Intake -  Output 750 ml  Net -750 ml    PHYSICAL EXAMINATION:  GENERAL:  85 y.o.-year-old patient lying in the bed with no acute distress.  EYES: Pupils equal, round, reactive to light and accommodation. No scleral icterus. Extraocular muscles intact.  HEENT: Head atraumatic, normocephalic. Oropharynx and nasopharynx clear.  NECK:  Supple, no jugular venous distention. No thyroid enlargement, no tenderness.  LUNGS: Normal breath sounds bilaterally, no wheezing, rales,rhonchi or crepitation. No use of accessory muscles of respiration.  CARDIOVASCULAR: S1, S2 normal. No murmurs, rubs, or gallops.  ABDOMEN: Soft, non-tender, non-distended. Bowel sounds present. No organomegaly or mass.  EXTREMITIES: No pedal edema, cyanosis, or clubbing.  NEUROLOGIC: Cranial nerves II through XII are intact. Muscle strength 5/5 in all extremities. Sensation intact. Gait not checked.  PSYCHIATRIC: The patient is alert and oriented x 3.  SKIN: No obvious rash, lesion, or ulcer.   DATA REVIEW:   CBC Recent Labs  Lab  01/25/19 1037  WBC 10.6*  HGB 15.7*  HCT 47.2*  PLT 238    Chemistries  Recent Labs  Lab 01/22/19 1714 01/22/19 1821  NA 136  --   K 3.7  --   CL 102  --   CO2 28  --   GLUCOSE 102*  --   BUN 22  --   CREATININE 0.81  --   CALCIUM 9.0  --   MG  --  2.1  AST 26  --   ALT 20  --   ALKPHOS 68  --   BILITOT 0.6  --     Cardiac Enzymes Recent Labs  Lab 01/22/19 1821  TROPONINI <0.03    Microbiology Results  Results for orders placed or performed during the hospital encounter of 01/22/19  Urine culture     Status: Abnormal   Collection Time: 01/22/19  5:14 PM  Result Value Ref Range Status   Specimen Description URINE, RANDOM  Final   Special Requests    Final    NONE Performed at [facility], [address] [place], [place] [address]    Culture (A)  Final    30,000 COLONIES/mL MULTIPLE SPECIES PRESENT, SUGGEST RECOLLECTION   Report Status 01/24/2019 FINAL  Final    RADIOLOGY:  Dg Chest 2 View  Result Date: 01/25/2019 CLINICAL DATA:  Dementia. EXAM: CHEST - 2 VIEW COMPARISON:  Radiograph of January 22, 2019. FINDINGS: Stable cardiomegaly. No pneumothorax or pleural effusion is noted. Large hiatal hernia is noted. No acute pulmonary disease is noted. Bony thorax is unremarkable. IMPRESSION: No active cardiopulmonary disease.  Large hiatal hernia. Electronically Signed   By: Marijo Conception, M.D.   On: 01/25/2019 11:36    EKG:   Orders placed or performed during the hospital encounter of 01/22/19  . ED EKG  . ED EKG  . EKG 12-Lead  . EKG 12-Lead  . EKG 12-Lead  . EKG 12-Lead      Management plans discussed with the patient, family and they are in agreement.  CODE STATUS:     Code Status Orders  (From admission, onward)         Start     Ordered   01/22/19 2200  Do not attempt resuscitation (DNR)  Continuous    Question Answer Comment  In the event of cardiac or respiratory ARREST Do not call a "code blue"   In the event of cardiac or respiratory ARREST Do not perform Intubation, CPR, defibrillation or ACLS   In the event of cardiac or respiratory ARREST Use medication by any route, position, wound care, and other measures to relive pain and suffering. May use oxygen, suction and manual treatment of airway obstruction as needed for comfort.      01/22/19 2159        Code Status History    Date Active Date Inactive Code Status Order ID Comments User Context   07/02/2018 1159 07/05/2018 2024 DNR [id number]  Dustin Flock, MD Inpatient   07/02/2018 0619 07/02/2018 1159 Full Code [id number]  Lance Coon, MD Inpatient      TOTAL TIME TAKING CARE OF THIS PATIENT: 40 minutes.    Avel Peace Salary M.D on  01/26/2019 at 12:36 PM  Between 7am to 6pm - Pager - [phone]  After 6pm go to [email or web address] - password [email or web address] [facility] [facility]  Office  [phone]  CC: Primary care physician; Rusty Aus, MD   Note: This dictation was prepared with  Dragon dictation along with smaller Company secretary. Any transcriptional errors that result from this process are unintentional.

## 2019-01-26 NOTE — Plan of Care (Signed)
  Problem: Clinical Measurements: Goal: Ability to maintain clinical measurements within normal limits will improve Outcome: Progressing   Problem: Pain Managment: Goal: General experience of comfort will improve Outcome: Progressing   

## 2019-01-26 NOTE — Clinical Social Work Note (Addendum)
CSW spoke with patient's husband Timmothy Sours via phone, [phone].  He stated he wants patient to return back home with home health services.  Patient's husband states that he will work on trying to make arrangements for her to have some extra care in the home.  CSW suggested that patient go to a SNF for short term rehab, which would give him a few days to get some in home care providers set up.  CSW informed him that if she goes home with home health, they will not be out every day to see patient.  Patient's husband was also informed that he would have to pay privately for extra in home care.  Patient's husband asked about applying for Medicaid and if they would be able to help pay for in home care services, CSW informed him that no, Medicaid would not initially.  CSW informed him that he would have to ask for the Caps program, which would assist once patient has been approved for Medicaid, and he would have to be put on a wait list for the program.  CSW also suggested that if patient has home health involved a home health social worker can be involved with providing her with assistance.  Patient's husband stated that he will call CSW back after he decides if he wants patient to go to SNF.  CSW continuing to follow patient's progress throughout discharge planning.  3:30pm  CSW spoke to patient's husband Timmothy Sours, and discussed SNF placement verse going home with home health.  Patient's husband agreed to have patient go to [facility] for short term rehab.  CSW contacted [facility] and asked them to start insurance authorization.  CSW contacted patient's daughter Amadeo Garnet, [phone] and also updated her on patient's husband's choice.  Jones Broom. Norval [place], MSW, [place]  01/26/2019 12:33 PM

## 2019-01-27 MED ORDER — TRAMADOL HCL 50 MG PO TABS
50.0000 mg | ORAL_TABLET | Freq: Four times a day (QID) | ORAL | Status: DC | PRN
  Administered 2019-01-28: 50 mg via ORAL
  Filled 2019-01-27: qty 1

## 2019-01-27 MED ORDER — ALPRAZOLAM 0.25 MG PO TABS: 0.2500 mg | ORAL_TABLET | Freq: Two times a day (BID) | ORAL | 0 refills | Status: DC | PRN

## 2019-01-27 MED ORDER — SODIUM CHLORIDE 0.9 % IV BOLUS
250.0000 mL | Freq: Once | INTRAVENOUS | Status: AC
  Administered 2019-01-27: 250 mL via INTRAVENOUS

## 2019-01-27 MED ORDER — LOSARTAN POTASSIUM 50 MG PO TABS: 50.0000 mg | ORAL_TABLET | Freq: Every day | ORAL | 0 refills | Status: DC

## 2019-01-27 MED ORDER — TRAMADOL HCL 50 MG PO TABS: 50.0000 mg | ORAL_TABLET | Freq: Four times a day (QID) | ORAL | 0 refills | Status: DC | PRN

## 2019-01-27 NOTE — Clinical Social Work Note (Addendum)
CSW has not received insurance authorization yet, CSW updated patient's daughter [name] [phone], and attempted to contact patient's husband Timmothy Sours, but he does not have a voice mail set up.  CSW continuing to follow patient's progress throughout discharge planning.  CSW updated bedside nurse and physician.  Jones Broom. [place], MSW, [place]  01/27/2019 5:29 PM

## 2019-01-27 NOTE — Progress Notes (Addendum)
Pt BP was at 82/36 HR 73. Talked to Dr. Earleen [place] and states will place the order. Will continue to monitor.  Update: Pt BP 144/56 HR 73. Talked to Dr. Jannifer [place] and ordered to change scheduled tramadol 100 mg to as needed for pain.

## 2019-01-27 NOTE — Discharge Summary (Addendum)
[facility] NAME: Paige Vasquez    MR#:  [id number]  DATE OF BIRTH:  Aug 09, 1927  DATE OF ADMISSION:  01/22/2019 ADMITTING PHYSICIAN: Fritzi Mandes, MD  DATE OF DISCHARGE: No discharge date for patient encounter.  PRIMARY CARE PHYSICIAN: Rusty Aus, MD    ADMISSION DIAGNOSIS:  Bradycardia [R00.1] Urinary tract infection without hematuria, site unspecified [N39.0] Altered mental status, unspecified altered mental status type [R41.82]  DISCHARGE DIAGNOSIS:  Active Problems:   Bradycardia   SECONDARY DIAGNOSIS:   Past Medical History:  Diagnosis Date  . Cystitis   . GERD (gastroesophageal reflux disease)   . HTN (hypertension)   . RLS (restless legs syndrome)     HOSPITAL COURSE:  [name] a89 y.o.femalewith a known history of advanced dementia, hypertension, restless leg syndrome comes to the emergency room with daughter. Patient has Alzheimer's dementia which has been progressively declining for last six months according to the daughters  *Acute sinus bradycardia Resolved Secondary to beta-blocker therapy which was discontinued Cardiology did see patient while in house-we will arrange for follow-up appointment in 1 to 2 weeks for reevaluation   *Acute UTI Resolved Treated with 3-day course of Rocephin, urine culture noted for contamination  *Acute upper right chest discomfort Resolved Difficult interview due to dementia Treated with GI cocktail, chest x-ray was unimpressive, Tylenol  No evidence for ACS clinically  *ChronicAdvanced/progressive dementia with intermittent aggressive behavior Stable D/Wpatient's daughters at length, husband wants patient at home however has been difficult to manage, physical therapy did see patient while in house, plans for skilled nursing facility placement status post discharge  *Chronic hypertension Stable on current regiment   DISCHARGE CONDITIONS:    stable  CONSULTS OBTAINED:  Treatment Team:  Fritzi Mandes, MD  DRUG ALLERGIES:  No Known Allergies  DISCHARGE MEDICATIONS:   Allergies as of 01/27/2019   No Known Allergies     Medication List    STOP taking these medications   metoprolol tartrate 25 MG tablet Commonly known as:  LOPRESSOR     TAKE these medications   acetaminophen 500 MG tablet Commonly known as:  TYLENOL Take 500-1,000 mg by mouth every 6 (six) hours as needed for mild pain or fever.   acetaminophen 325 MG tablet Commonly known as:  TYLENOL Take 2 tablets (650 mg total) by mouth every 6 (six) hours as needed for mild pain (or Fever >/= 101).   ALPRAZolam 0.25 MG tablet Commonly known as:  XANAX Take 1 tablet (0.25 mg total) by mouth 2 (two) times daily as needed for anxiety. What changed:  when to take this   AZO CRANBERRY 250-30 MG Tabs Take 1 tablet by mouth 2 (two) times daily.   cholecalciferol 25 MCG (1000 UT) tablet Commonly known as:  VITAMIN D Take 1,000 Units by mouth daily.   FLUoxetine 10 MG capsule Commonly known as:  PROZAC Take 10 mg by mouth daily.   losartan 50 MG tablet Commonly known as:  COZAAR Take 1 tablet (50 mg total) by mouth daily.   pramipexole 0.25 MG tablet Commonly known as:  MIRAPEX Take 0.25 mg by mouth 2 (two) times daily.   PRESERVISION AREDS 2+MULTI VIT Caps Take 2 capsules by mouth daily.   traMADol 50 MG tablet Commonly known as:  ULTRAM Take 1 tablet (50 mg total) by mouth every 6 (six) hours as needed for severe pain. What changed:  reasons to take this   vitamin B-12 1000 MCG  tablet Commonly known as:  CYANOCOBALAMIN Take 1,000 mcg by mouth daily.        DISCHARGE INSTRUCTIONS:    If you experience worsening of your admission symptoms, develop shortness of breath, life threatening emergency, suicidal or homicidal thoughts you must seek medical attention immediately by calling 911 or calling your MD immediately  if symptoms less  severe.  You Must read complete instructions/literature along with all the possible adverse reactions/side effects for all the Medicines you take and that have been prescribed to you. Take any new Medicines after you have completely understood and accept all the possible adverse reactions/side effects.   Please note  You were cared for by a hospitalist during your hospital stay. If you have any questions about your discharge medications or the care you received while you were in the hospital after you are discharged, you can call the unit and asked to speak with the hospitalist on call if the hospitalist that took care of you is not available. Once you are discharged, your primary care physician will handle any further medical issues. Please note that NO REFILLS for any discharge medications will be authorized once you are discharged, as it is imperative that you return to your primary care physician (or establish a relationship with a primary care physician if you do not have one) for your aftercare needs so that they can reassess your need for medications and monitor your lab values.    Today   CHIEF COMPLAINT:   Chief Complaint  Patient presents with  . Altered Mental Status    HISTORY OF PRESENT ILLNESS:  85 y.o. female with a known history of advanced dementia, hypertension, restless leg syndrome comes to the emergency room with daughter. Patient has Alzheimer's dementia which has been progressively declining for last six months according to the daughter. Patient has been quite difficult at home and lives with her husband. She at times becomes suspicious about her husband and does not listen to him. Today she went to the neighbors house to call on cops and asked her husband to leave the home.  In the ER patient was found to have UTI. She was also noted to have heart rate upper 30s to 40s. She is on metoprolol which has been held.  She is being admitted for bradycardia, UTI and  worsening dementia. VITAL SIGNS:  Blood pressure (!) 101/52, pulse 61, temperature 98.4 F (36.9 C), temperature source Oral, resp. rate 19, height 5\' 3"  (1.6 m), weight 54.4 kg, SpO2 95 %.  I/O:    Intake/Output Summary (Last 24 hours) at 01/27/2019 1234 Last data filed at 01/27/2019 0953 Gross per 24 hour  Intake 480 ml  Output 820 ml  Net -340 ml    PHYSICAL EXAMINATION:  GENERAL:  85 y.o.-year-old patient lying in the bed with no acute distress.  EYES: Pupils equal, round, reactive to light and accommodation. No scleral icterus. Extraocular muscles intact.  HEENT: Head atraumatic, normocephalic. Oropharynx and nasopharynx clear.  NECK:  Supple, no jugular venous distention. No thyroid enlargement, no tenderness.  LUNGS: Normal breath sounds bilaterally, no wheezing, rales,rhonchi or crepitation. No use of accessory muscles of respiration.  CARDIOVASCULAR: S1, S2 normal. No murmurs, rubs, or gallops.  ABDOMEN: Soft, non-tender, non-distended. Bowel sounds present. No organomegaly or mass.  EXTREMITIES: No pedal edema, cyanosis, or clubbing.  NEUROLOGIC: Cranial nerves II through XII are intact. Muscle strength 5/5 in all extremities. Sensation intact. Gait not checked.  PSYCHIATRIC: The patient is alert and oriented  x 3.  SKIN: No obvious rash, lesion, or ulcer.   DATA REVIEW:   CBC Recent Labs  Lab 01/25/19 1037  WBC 10.6*  HGB 15.7*  HCT 47.2*  PLT 238    Chemistries  Recent Labs  Lab 01/22/19 1714 01/22/19 1821  NA 136  --   K 3.7  --   CL 102  --   CO2 28  --   GLUCOSE 102*  --   BUN 22  --   CREATININE 0.81  --   CALCIUM 9.0  --   MG  --  2.1  AST 26  --   ALT 20  --   ALKPHOS 68  --   BILITOT 0.6  --     Cardiac Enzymes Recent Labs  Lab 01/22/19 1821  TROPONINI <0.03    Microbiology Results  Results for orders placed or performed during the hospital encounter of 01/22/19  Urine culture     Status: Abnormal   Collection Time: 01/22/19  5:14  PM  Result Value Ref Range Status   Specimen Description URINE, RANDOM  Final   Special Requests   Final    NONE Performed at [facility], [address] [place], [place] [address]    Culture (A)  Final    30,000 COLONIES/mL MULTIPLE SPECIES PRESENT, SUGGEST RECOLLECTION   Report Status 01/24/2019 FINAL  Final    RADIOLOGY:  No results found.  EKG:   Orders placed or performed during the hospital encounter of 01/22/19  . ED EKG  . ED EKG  . EKG 12-Lead  . EKG 12-Lead  . EKG 12-Lead  . EKG 12-Lead      Management plans discussed with the patient, family and they are in agreement.  CODE STATUS:     Code Status Orders  (From admission, onward)         Start     Ordered   01/22/19 2200  Do not attempt resuscitation (DNR)  Continuous    Question Answer Comment  In the event of cardiac or respiratory ARREST Do not call a "code blue"   In the event of cardiac or respiratory ARREST Do not perform Intubation, CPR, defibrillation or ACLS   In the event of cardiac or respiratory ARREST Use medication by any route, position, wound care, and other measures to relive pain and suffering. May use oxygen, suction and manual treatment of airway obstruction as needed for comfort.      01/22/19 2159        Code Status History    Date Active Date Inactive Code Status Order ID Comments User Context   07/02/2018 1159 07/05/2018 2024 DNR [id number]  Dustin Flock, MD Inpatient   07/02/2018 0619 07/02/2018 1159 Full Code [id number]  Lance Coon, MD Inpatient      TOTAL TIME TAKING CARE OF THIS PATIENT: 40 minutes.    Paige Vasquez  M.D on 01/27/2019 at 12:34 PM  Between 7am to 6pm - Pager - [phone]  After 6pm go to [email or web address] - password [email or web address] [facility]  Office  [phone]  CC: Primary care physician; Rusty Aus, MD   Note: This dictation was prepared with Dragon dictation along with smaller phrase technology. Any  transcriptional errors that result from this process are unintentional.

## 2019-01-28 MED ORDER — TRAMADOL HCL 50 MG PO TABS: 50.0000 mg | ORAL_TABLET | Freq: Four times a day (QID) | ORAL | 0 refills | Status: DC | PRN

## 2019-01-28 MED ORDER — ALPRAZOLAM 0.25 MG PO TABS: 0.2500 mg | ORAL_TABLET | Freq: Two times a day (BID) | ORAL | 0 refills | Status: DC | PRN

## 2019-01-28 NOTE — Progress Notes (Signed)
[facility] [place] NAME: Paige Vasquez    MR#:  [id number]  DATE OF BIRTH:  02-27-1927  SUBJECTIVE:  Patient continues to be pleasantly confused and disoriented, awaiting placement  REVIEW OF SYSTEMS:  CONSTITUTIONAL: No fever, fatigue or weakness.  EYES: No blurred or double vision.  EARS, NOSE, AND THROAT: No tinnitus or ear pain.  RESPIRATORY: No cough, shortness of breath, wheezing or hemoptysis.  CARDIOVASCULAR: No chest pain, orthopnea, edema.  GASTROINTESTINAL: No nausea, vomiting, diarrhea or abdominal pain.  GENITOURINARY: No dysuria, hematuria.  ENDOCRINE: No polyuria, nocturia,  HEMATOLOGY: No anemia, easy bruising or bleeding SKIN: No rash or lesion. MUSCULOSKELETAL: No joint pain or arthritis.   NEUROLOGIC: No tingling, numbness, weakness.  PSYCHIATRY: No anxiety or depression.   ROS  DRUG ALLERGIES:  No Known Allergies  VITALS:  Blood pressure (!) 129/59, pulse (!) 56, temperature 97.7 F (36.5 C), temperature source Oral, resp. rate 19, height 5\' 3"  (1.6 m), weight 53.9 kg, SpO2 100 %.  PHYSICAL EXAMINATION:  GENERAL:  85 y.o.-year-old patient lying in the bed with no acute distress.  EYES: Pupils equal, round, reactive to light and accommodation. No scleral icterus. Extraocular muscles intact.  HEENT: Head atraumatic, normocephalic. Oropharynx and nasopharynx clear.  NECK:  Supple, no jugular venous distention. No thyroid enlargement, no tenderness.  LUNGS: Normal breath sounds bilaterally, no wheezing, rales,rhonchi or crepitation. No use of accessory muscles of respiration.  CARDIOVASCULAR: S1, S2 normal. No murmurs, rubs, or gallops.  ABDOMEN: Soft, nontender, nondistended. Bowel sounds present. No organomegaly or mass.  EXTREMITIES: No pedal edema, cyanosis, or clubbing.  NEUROLOGIC: Cranial nerves II through XII are intact. Muscle strength 5/5 in all extremities. Sensation intact. Gait not checked.   PSYCHIATRIC: The patient is alert and oriented x 3.  SKIN: No obvious rash, lesion, or ulcer.   Physical Exam LABORATORY PANEL:   CBC Recent Labs  Lab 01/25/19 1037  WBC 10.6*  HGB 15.7*  HCT 47.2*  PLT 238   ------------------------------------------------------------------------------------------------------------------  Chemistries  Recent Labs  Lab 01/22/19 1714 01/22/19 1821  NA 136  --   K 3.7  --   CL 102  --   CO2 28  --   GLUCOSE 102*  --   BUN 22  --   CREATININE 0.81  --   CALCIUM 9.0  --   MG  --  2.1  AST 26  --   ALT 20  --   ALKPHOS 68  --   BILITOT 0.6  --    ------------------------------------------------------------------------------------------------------------------  Cardiac Enzymes Recent Labs  Lab 01/22/19 1821  TROPONINI <0.03   ------------------------------------------------------------------------------------------------------------------  RADIOLOGY:  No results found.  ASSESSMENT AND PLAN:  [name] a85 y.o.femalewith a known history of advanced dementia, hypertension, restless leg syndrome comes to the emergency room with daughter. Patient has Alzheimer's dementia which has been progressively declining for last six months according to the daughters  *Acute sinus bradycardia Resolved Secondary to beta-blocker therapy which was discontinued Cardiology did see patient while in house-we will arrange for follow-up appointment in 1 to 2 weeks for reevaluation   *Acute UTI Resolved Treated with 3-day course of Rocephin, urine culture noted for contamination  *Acute upper right chest discomfort Resolved Difficult interview due to dementia Treated with GI cocktail, chest x-ray was unimpressive, Tylenol  No evidence for ACS clinically  *ChronicAdvanced/progressive dementia with intermittent aggressive behavior Stable D/Wpatient's daughters at length -they would like the patient to be placed in a  facility/skilled nursing facility, husband wants patient at home however has been difficult to manage, physical therapy did see patient-recommended home health PT status post discharge   *Chronic hypertension Stable on current regiment   Patient is ready for discharge Disposition pending case management/social work -home with home health PT versus SNF   All the records are reviewed and case discussed with Care Management/Social Workerr. Management plans discussed with the patient, family and they are in agreement.  CODE STATUS: dnr  TOTAL TIME TAKING CARE OF THIS PATIENT: 40 minutes.     POSSIBLE D/C IN 1-5 DAYS, DEPENDING ON CLINICAL CONDITION.   Avel Peace Salary M.D on 01/28/2019   Between 7am to 6pm - Pager - [phone]  After 6pm go to [email or web address] - password [email or web address] [place] [facility]  Office  [phone]  CC: Primary care physician; Rusty Aus, MD  Note: This dictation was prepared with Dragon dictation along with smaller phrase technology. Any transcriptional errors that result from this process are unintentional.

## 2019-01-28 NOTE — Progress Notes (Signed)
Patient discharged to [facility] via EMS.  Report given to EMS.

## 2019-01-28 NOTE — Care Management Important Message (Signed)
Copy of signed Medicare IM left with patient in room. 

## 2019-01-28 NOTE — NC FL2 (Signed)
[place] LEVEL OF CARE SCREENING TOOL     IDENTIFICATION  Patient Name: Paige Vasquez Birthdate: 10-Mar-1927 Sex: female Admission Date (Current Location): 01/22/2019  [place] and [place] Number:  Engineering geologist and Address:  [facility], [address] [place], [place] [address]      Provider Number: [id number]  Attending Physician Name and Address:  Gorden Harms, MD  Relative Name and Phone Number:  Suzi Roots Daughter   [phone] or KEAIRA, WHITEHURST  [phone] or DHRUTI, GHUMAN  [phone]     Current Level of Care: Hospital Recommended Level of Care: [place], Memory Care Prior Approval Number:    Date Approved/Denied:   PASRR Number: [id number]  Discharge Plan: Domiciliary (Rest home)(ALF)    Current Diagnoses: Patient Active Problem List   Diagnosis Date Noted  . Bradycardia 01/22/2019  . UTI (urinary tract infection) 07/02/2018  . Falls 07/02/2018  . HTN (hypertension) 07/02/2018  . GERD (gastroesophageal reflux disease) 07/02/2018  . RLS (restless legs syndrome) 07/02/2018    Orientation RESPIRATION BLADDER Height & Weight     Self, Place  Normal Continent Weight: 118 lb 14 oz (53.9 kg) Height:  5\' 3"  (160 cm)  BEHAVIORAL SYMPTOMS/MOOD NEUROLOGICAL BOWEL NUTRITION STATUS      Continent Diet  AMBULATORY STATUS COMMUNICATION OF NEEDS Skin   Limited Assist Verbally Normal                       Personal Care Assistance Level of Assistance  Bathing, Feeding, Dressing Bathing Assistance: Limited assistance Feeding assistance: Independent Dressing Assistance: Limited assistance     Functional Limitations Info  Sight, Hearing, Speech Sight Info: Adequate Hearing Info: Adequate Speech Info: Adequate    SPECIAL CARE FACTORS FREQUENCY  PT (By licensed PT)     PT Frequency: Home Health PT minimum 2x a week               Contractures Contractures Info: Not present     Additional Factors Info  Code Status, Allergies, Psychotropic Code Status Info: DNR Allergies Info: NKA Psychotropic Info: FLUoxetine (PROZAC) capsule 10 mg          Current Medications (01/28/2019):  This is the current hospital active medication list Current Facility-Administered Medications  Medication Dose Route Frequency Provider Last Rate Last Dose  . acetaminophen (TYLENOL) tablet 650 mg  650 mg Oral Q6H PRN Fritzi Mandes, MD   650 mg at 01/23/19 1550   Or  . acetaminophen (TYLENOL) suppository 650 mg  650 mg Rectal Q6H PRN Fritzi Mandes, MD      . ALPRAZolam Duanne Moron) tablet 0.25 mg  0.25 mg Oral TID PRN Fritzi Mandes, MD   0.25 mg at 01/28/19 1323  . cholecalciferol (VITAMIN D) tablet 1,000 Units  1,000 Units Oral Daily Fritzi Mandes, MD   1,000 Units at 01/28/19 [phone]  . enoxaparin (LOVENOX) injection 40 mg  40 mg Subcutaneous Q24H Fritzi Mandes, MD   40 mg at 01/27/19 2116  . FLUoxetine (PROZAC) capsule 10 mg  10 mg Oral Daily Fritzi Mandes, MD   10 mg at 01/28/19 0939  . multivitamin-lutein (OCUVITE-LUTEIN) capsule 2 capsule  2 capsule Oral Daily Fritzi Mandes, MD   2 capsule at 01/28/19 0939  . polyethylene glycol (MIRALAX / GLYCOLAX) packet 17 g  17 g Oral Daily PRN Fritzi Mandes, MD      . pramipexole (MIRAPEX) tablet 0.25 mg  0.25 mg Oral BID Fritzi Mandes, MD  0.25 mg at 01/28/19 0939  . traMADol (ULTRAM) tablet 50 mg  50 mg Oral Q6H PRN Lance Coon, MD   50 mg at 01/28/19 2336  . vitamin B-12 (CYANOCOBALAMIN) tablet 1,000 mcg  1,000 mcg Oral Daily Fritzi Mandes, MD   1,000 mcg at 01/28/19 1224     Discharge Medications: Please see discharge summary for a list of discharge medications.  Relevant Imaging Results:  Relevant Lab Results:   Additional Information ss: [id number]  Ross Ludwig, LCSWA

## 2019-01-28 NOTE — Progress Notes (Signed)
Report called and given to Clifford at [facility].

## 2019-01-28 NOTE — Progress Notes (Signed)
Physical Therapy Treatment Patient Details Name: Paige Vasquez MRN: [id number] DOB: 09-05-1927 Today's Date: 01/28/2019    History of Present Illness 85 y.o F addmitted on 01/22/2019 with dx of bradycardia, UTI. history of advanced dementia, hypertension, restless leg syndrome.    PT Comments    Pt is able to ambulate confidently and safely around the nurses' station with no safety issues apart from needing consistent directional cuing for safety/awareness.  She showed good effort and was very pleasant the entire time.  She managed walker well with no true reliance for safety and her vitals stayed stable and appropriate t/o the effort. Pt with no c/o pain, showed good strength/balance/quality of motion, etc and generally did well.  Pt continues to require 24/7 supervision for all tasks.  Follow Up Recommendations  Supervision/Assistance - 24 hour;Home health PT     Equipment Recommendations       Recommendations for Other Services       Precautions / Restrictions Precautions Precautions: Fall Restrictions Weight Bearing Restrictions: No    Mobility  Bed Mobility               General bed mobility comments: NT, pt in recliner  Transfers Overall transfer level: Needs assistance Equipment used: Rolling walker (2 wheeled) Transfers: Sit to/from Stand Sit to Stand: Modified independent (Device/Increase time)         General transfer comment: Pt was able to rise to standing w/o assist and did not need RW to maintain balance. Good confidence and no LOBs or safety issues   Ambulation/Gait Ambulation/Gait assistance: Modified independent (Device/Increase time) Gait Distance (Feet): 200 Feet Assistive device: Rolling walker (2 wheeled)       General Gait Details: Pt was able to maintain consistent and confident cadence with only very light walk use.  Her HR rose from high 70s to low 90s and O2 remained in the 90s on room air with the effort (pt able to speak comfortably  t/o the effort)   Stairs             Wheelchair Mobility    Modified Rankin (Stroke Patients Only)       Balance Overall balance assessment: Modified Independent                                          Cognition Arousal/Alertness: Awake/alert Behavior During Therapy: WFL for tasks assessed/performed(confused, but appropriate t/o session) Overall Cognitive Status: History of cognitive impairments - at baseline                                 General Comments: Pt needed regular cuing and reassurance, no family present to confirm but per notes it appears she is at/near baseline      Exercises      General Comments        Pertinent Vitals/Pain Pain Assessment: No/denies pain    Home Living                      Prior Function            PT Goals (current goals can now be found in the care plan section) Progress towards PT goals: Progressing toward goals    Frequency    Min 2X/week      PT Plan Current plan remains  appropriate    Co-evaluation              AM-PAC PT "6 Clicks" Mobility   Outcome Measure  Help needed turning from your back to your side while in a flat bed without using bedrails?: None Help needed moving from lying on your back to sitting on the side of a flat bed without using bedrails?: None Help needed moving to and from a bed to a chair (including a wheelchair)?: None Help needed standing up from a chair using your arms (e.g., wheelchair or bedside chair)?: None Help needed to walk in hospital room?: None Help needed climbing 3-5 steps with a railing? : A Little 6 Click Score: 23    End of Session Equipment Utilized During Treatment: Gait belt Activity Tolerance: Patient tolerated treatment well Patient left: with call bell/phone within reach;with chair alarm set   PT Visit Diagnosis: Unsteadiness on feet (R26.81);History of falling (Z91.81);Ataxic gait (R26.0);Difficulty in  walking, not elsewhere classified (R26.2);Muscle weakness (generalized) (M62.81)     Time: 3754-3606 PT Time Calculation (min) (ACUTE ONLY): 16 min  Charges:  $Gait Training: 8-22 mins                     Kreg Shropshire, DPT 01/28/2019, 12:47 PM

## 2019-01-28 NOTE — Clinical Social Work Note (Signed)
CSW was informed that patient has been approved for SNF placement at [facility].  CSW updated patient's daughters [name] [phone] and [name] [phone], CSW unable to get a hold of patient's husband and he did not have voice mail set up.  Patient to be d/c'ed today to [facility], room 501.  Patient and family agreeable to plans will transport via ems RN to call report [phone].  Patient's family aware that she is discharging today.  Evette Cristal, MSW, [place]

## 2019-01-28 NOTE — Clinical Social Work Note (Signed)
CSW spoke to patient's daughter [name] who was at bedside what memory care facilities have beds available.  CSW received permission to send clinicals to different ALFs.  CSW informed patient's daughter [name] [phone] and patient's other daughter [name] [phone] that CSW has sent information to [facility], [place] so they can review patient's information.  CSW informed her that [facility], [facility], and [facility] did not have any beds available.  Patient's family were then informed that CSW had receive insurance authorization for SNF placement.  CSW provided the list of ALFs who are are reviewing patient's information for patient's daughter.  Patient's daughter was appreciative of information given.  CSW tried to contact patient's husband, however he did not have a voice mail that was working.  CSW to continue to facilitate discharge planning.  Jones Broom. Norval Morton, MSW, [place]  01/28/2019 6:29 PM

## 2019-01-28 NOTE — Plan of Care (Signed)
  Problem: Activity: Goal: Risk for activity intolerance will decrease Outcome: Progressing   Problem: Safety: Goal: Ability to remain free from injury will improve Outcome: Progressing   

## 2019-01-28 NOTE — Discharge Summary (Addendum)
[facility] NAME: Paige Vasquez    MR#:  [id number]  DATE OF BIRTH:  [birth year]  DATE OF ADMISSION:  01/22/2019 ADMITTING PHYSICIAN: Fritzi Mandes, MD  DATE OF DISCHARGE: No discharge date for patient encounter.  PRIMARY CARE PHYSICIAN: Rusty Aus, MD    ADMISSION DIAGNOSIS:  Bradycardia [R00.1] Urinary tract infection without hematuria, site unspecified [N39.0] Altered mental status, unspecified altered mental status type [R41.82]  DISCHARGE DIAGNOSIS:  Active Problems:   Bradycardia   SECONDARY DIAGNOSIS:   Past Medical History:  Diagnosis Date  . Cystitis   . GERD (gastroesophageal reflux disease)   . HTN (hypertension)   . RLS (restless legs syndrome)     HOSPITAL COURSE:  Paige Vasquez a known history of advanced dementia, hypertension, restless leg syndrome comes to the emergency room with daughter. Patient has Alzheimer's dementia which has been progressively declining for last six months according to the daughters  *Acute sinus bradycardia Resolved Secondary to beta-blocker therapy which was discontinued Cardiology did see patient while in house-we will arrange for follow-up appointment in 1 to 2 weeks for reevaluation   *Acute UTI Resolved Treated with 3-day course of Rocephin, urine culture noted for contamination  *Acute upper right chest discomfort Resolved Difficult interview due to dementia Treated with GI cocktail, chest x-ray was unimpressive, Tylenol  No evidence for ACS clinically  *ChronicAdvanced/progressive dementia with intermittent aggressive behavior Stable D/Wpatient's daughters at length, husband wants patient at home however has been difficult to manage, physical therapy did see patient while in house, plans for skilled nursing facility placement status post discharge  *Chronic hypertension Stable on current regiment   DISCHARGE CONDITIONS:    stable  CONSULTS OBTAINED:  Treatment Team:  Fritzi Mandes, MD  DRUG ALLERGIES:  No Known Allergies  DISCHARGE MEDICATIONS:   Allergies as of 01/28/2019   No Known Allergies     Medication List    STOP taking these medications   metoprolol tartrate 25 MG tablet Commonly known as:  LOPRESSOR     TAKE these medications   acetaminophen 500 MG tablet Commonly known as:  TYLENOL Take 500-1,000 mg by mouth every 6 (six) hours as needed for mild pain or fever.   acetaminophen 325 MG tablet Commonly known as:  TYLENOL Take 2 tablets (650 mg total) by mouth every 6 (six) hours as needed for mild pain (or Fever >/= 101).   ALPRAZolam 0.25 MG tablet Commonly known as:  XANAX Take 1 tablet (0.25 mg total) by mouth 2 (two) times daily as needed for anxiety. What changed:  when to take this   AZO CRANBERRY 250-30 MG Tabs Take 1 tablet by mouth 2 (two) times daily.   cholecalciferol 25 MCG (1000 UT) tablet Commonly known as:  VITAMIN D Take 1,000 Units by mouth daily.   FLUoxetine 10 MG capsule Commonly known as:  PROZAC Take 10 mg by mouth daily.   losartan 50 MG tablet Commonly known as:  COZAAR Take 1 tablet (50 mg total) by mouth daily.   pramipexole 0.25 MG tablet Commonly known as:  MIRAPEX Take 0.25 mg by mouth 2 (two) times daily.   PRESERVISION AREDS 2+MULTI VIT Caps Take 2 capsules by mouth daily.   traMADol 50 MG tablet Commonly known as:  ULTRAM Take 1 tablet (50 mg total) by mouth every 6 (six) hours as needed for severe pain. What changed:  reasons to take this   vitamin B-12 1000 MCG  tablet Commonly known as:  CYANOCOBALAMIN Take 1,000 mcg by mouth daily.        DISCHARGE INSTRUCTIONS:    If you experience worsening of your admission symptoms, develop shortness of breath, life threatening emergency, suicidal or homicidal thoughts you must seek medical attention immediately by calling 911 or calling your MD immediately  if symptoms less  severe.  You Must read complete instructions/literature along with all the possible adverse reactions/side effects for all the Medicines you take and that have been prescribed to you. Take any new Medicines after you have completely understood and accept all the possible adverse reactions/side effects.   Please note  You were cared for by a hospitalist during your hospital stay. If you have any questions about your discharge medications or the care you received while you were in the hospital after you are discharged, you can call the unit and asked to speak with the hospitalist on call if the hospitalist that took care of you is not available. Once you are discharged, your primary care physician will handle any further medical issues. Please note that NO REFILLS for any discharge medications will be authorized once you are discharged, as it is imperative that you return to your primary care physician (or establish a relationship with a primary care physician if you do not have one) for your aftercare needs so that they can reassess your need for medications and monitor your lab values.    Today   CHIEF COMPLAINT:   Chief Complaint  Patient presents with  . Altered Mental Status    HISTORY OF PRESENT ILLNESS:  85 y.o. female with a known history of advanced dementia, hypertension, restless leg syndrome comes to the emergency room with daughter. Patient has Alzheimer's dementia which has been progressively declining for last six months according to the daughter. Patient has been quite difficult at home and lives with her husband. She at times becomes suspicious about her husband and does not listen to him. Today she went to the neighbors house to call on cops and asked her husband to leave the home.  In the ER patient was found to have UTI. She was also noted to have heart rate upper 30s to 40s. She is on metoprolol which has been held.  She is being admitted for bradycardia, UTI and  worsening dementia. VITAL SIGNS:  Blood pressure (!) 129/59, pulse (!) 56, temperature 97.7 F (36.5 C), temperature source Oral, resp. rate 19, height 5\' 3"  (1.6 m), weight 53.9 kg, SpO2 100 %.  I/O:    Intake/Output Summary (Last 24 hours) at 01/28/2019 1535 Last data filed at 01/28/2019 1016 Gross per 24 hour  Intake 720 ml  Output 700 ml  Net 20 ml    PHYSICAL EXAMINATION:  GENERAL:  85 y.o.-year-old patient lying in the bed with no acute distress.  EYES: Pupils equal, round, reactive to light and accommodation. No scleral icterus. Extraocular muscles intact.  HEENT: Head atraumatic, normocephalic. Oropharynx and nasopharynx clear.  NECK:  Supple, no jugular venous distention. No thyroid enlargement, no tenderness.  LUNGS: Normal breath sounds bilaterally, no wheezing, rales,rhonchi or crepitation. No use of accessory muscles of respiration.  CARDIOVASCULAR: S1, S2 normal. No murmurs, rubs, or gallops.  ABDOMEN: Soft, non-tender, non-distended. Bowel sounds present. No organomegaly or mass.  EXTREMITIES: No pedal edema, cyanosis, or clubbing.  NEUROLOGIC: Cranial nerves II through XII are intact. Muscle strength 5/5 in all extremities. Sensation intact. Gait not checked.  PSYCHIATRIC: The patient is alert and  oriented x 3.  SKIN: No obvious rash, lesion, or ulcer.   DATA REVIEW:   CBC Recent Labs  Lab 01/25/19 1037  WBC 10.6*  HGB 15.7*  HCT 47.2*  PLT 238    Chemistries  Recent Labs  Lab 01/22/19 1714 01/22/19 1821  NA 136  --   K 3.7  --   CL 102  --   CO2 28  --   GLUCOSE 102*  --   BUN 22  --   CREATININE 0.81  --   CALCIUM 9.0  --   MG  --  2.1  AST 26  --   ALT 20  --   ALKPHOS 68  --   BILITOT 0.6  --     Cardiac Enzymes Recent Labs  Lab 01/22/19 1821  TROPONINI <0.03    Microbiology Results  Results for orders placed or performed during the hospital encounter of 01/22/19  Urine culture     Status: Abnormal   Collection Time: 01/22/19   5:14 PM  Result Value Ref Range Status   Specimen Description URINE, RANDOM  Final   Special Requests   Final    NONE Performed at [facility], [address] [place], [place] [address]    Culture (A)  Final    30,000 COLONIES/mL MULTIPLE SPECIES PRESENT, SUGGEST RECOLLECTION   Report Status 01/24/2019 FINAL  Final    RADIOLOGY:  No results found.  EKG:   Orders placed or performed during the hospital encounter of 01/22/19  . ED EKG  . ED EKG  . EKG 12-Lead  . EKG 12-Lead  . EKG 12-Lead  . EKG 12-Lead      Management plans discussed with the patient, family and they are in agreement.  CODE STATUS:     Code Status Orders  (From admission, onward)         Start     Ordered   01/22/19 2200  Do not attempt resuscitation (DNR)  Continuous    Question Answer Comment  In the event of cardiac or respiratory ARREST Do not call a "code blue"   In the event of cardiac or respiratory ARREST Do not perform Intubation, CPR, defibrillation or ACLS   In the event of cardiac or respiratory ARREST Use medication by any route, position, wound care, and other measures to relive pain and suffering. May use oxygen, suction and manual treatment of airway obstruction as needed for comfort.      01/22/19 2159        Code Status History    Date Active Date Inactive Code Status Order ID Comments User Context   07/02/2018 1159 07/05/2018 2024 DNR [id number]  Dustin Flock, MD Inpatient   07/02/2018 0619 07/02/2018 1159 Full Code [id number]  Lance Coon, MD Inpatient      TOTAL TIME TAKING CARE OF THIS PATIENT: 40 minutes.    Avel Peace Javeon Macmurray M.D on 01/28/2019 at 3:35 PM  Between 7am to 6pm - Pager - [phone]  After 6pm go to [email or web address] - password [email or web address] [facility]  Office  [phone]  CC: Primary care physician; Rusty Aus, MD   Note: This dictation was prepared with Dragon dictation along with smaller phrase technology. Any  transcriptional errors that result from this process are unintentional.

## 2019-01-31 DIAGNOSIS — E441 Mild protein-calorie malnutrition: Secondary | ICD-10-CM | POA: Insufficient documentation

## 2020-02-28 ENCOUNTER — Emergency Department
Admission: EM | Admit: 2020-02-28 | Discharge: 2020-02-28 | Disposition: A | Discharge status: Home / Self Care | Payer: Medicare Other | Attending: Emergency Medicine | Admitting: Emergency Medicine

## 2020-02-28 ENCOUNTER — Emergency Department: Payer: Medicare Other

## 2020-02-28 ENCOUNTER — Other Ambulatory Visit: Payer: Self-pay

## 2020-02-28 DIAGNOSIS — Y998 Other external cause status: Secondary | ICD-10-CM | POA: Diagnosis not present

## 2020-02-28 DIAGNOSIS — Z79899 Other long term (current) drug therapy: Secondary | ICD-10-CM | POA: Diagnosis not present

## 2020-02-28 DIAGNOSIS — I10 Essential (primary) hypertension: Secondary | ICD-10-CM | POA: Insufficient documentation

## 2020-02-28 DIAGNOSIS — W01198A Fall on same level from slipping, tripping and stumbling with subsequent striking against other object, initial encounter: Secondary | ICD-10-CM | POA: Insufficient documentation

## 2020-02-28 DIAGNOSIS — S0083XA Contusion of other part of head, initial encounter: Secondary | ICD-10-CM

## 2020-02-28 DIAGNOSIS — Y92128 Other place in nursing home as the place of occurrence of the external cause: Secondary | ICD-10-CM | POA: Diagnosis not present

## 2020-02-28 DIAGNOSIS — Y9389 Activity, other specified: Secondary | ICD-10-CM | POA: Insufficient documentation

## 2020-02-28 DIAGNOSIS — S0990XA Unspecified injury of head, initial encounter: Secondary | ICD-10-CM | POA: Diagnosis present

## 2020-02-28 DIAGNOSIS — W19XXXA Unspecified fall, initial encounter: Secondary | ICD-10-CM

## 2020-02-28 MED ORDER — ACETAMINOPHEN 500 MG PO TABS: 1000.0000 mg | ORAL_TABLET | Freq: Once | ORAL | Status: DC

## 2020-02-28 NOTE — ED Provider Notes (Signed)
[facility] Emergency Department Provider Note  ____________________________________________  Time seen: Approximately 2:06 AM  I have reviewed the triage vital signs and the nursing notes.   HISTORY  Chief Complaint Fall   HPI Paige Vasquez is a 86 y.o. female with a history of falls, bradycardia, hypertension who presents from [place] after a mechanical fall.  Patient reports that she was trying to get up to go to the bathroom, she was wearing socks and she slipped and fell.  She fell backwards hitting her head onto the floor.  No LOC.  She is not on blood thinners. She is   complaining of a moderate to occipital headache since the fall.  No neck pain or back pain.  She is also complaining of right knee pain.  She denies syncope or presyncopal symptoms leading to the fall.  No chest pain or shortness of breath, no abdominal pain.  No dysuria or hematuria.  Past Medical History:  Diagnosis Date  . Cystitis   . GERD (gastroesophageal reflux disease)   . HTN (hypertension)   . RLS (restless legs syndrome)     Patient Active Problem List   Diagnosis Date Noted  . Bradycardia 01/22/2019  . UTI (urinary tract infection) 07/02/2018  . Falls 07/02/2018  . HTN (hypertension) 07/02/2018  . GERD (gastroesophageal reflux disease) 07/02/2018  . RLS (restless legs syndrome) 07/02/2018    Past Surgical History:  Procedure Laterality Date  . ABDOMINAL HYSTERECTOMY    . NASAL SINUS SURGERY      Prior to Admission medications   Medication Sig Start Date End Date Taking? Authorizing Provider  acetaminophen (TYLENOL) 325 MG tablet Take 2 tablets (650 mg total) by mouth every 6 (six) hours as needed for mild pain (or Fever >/= 101). 07/05/18   Dustin Flock, MD  acetaminophen (TYLENOL) 500 MG tablet Take 500-1,000 mg by mouth every 6 (six) hours as needed for mild pain or fever.    [provider]  ALPRAZolam Duanne Moron) 0.25 MG tablet Take 1 tablet (0.25  mg total) by mouth 2 (two) times daily as needed for anxiety. 01/28/19   Salary, Avel Peace, MD  cholecalciferol (VITAMIN D) 25 MCG (1000 UT) tablet Take 1,000 Units by mouth daily.    [provider]  Cranberry-Vitamin C-Probiotic (AZO CRANBERRY) 250-30 MG TABS Take 1 tablet by mouth 2 (two) times daily.    [provider]  FLUoxetine (PROZAC) 10 MG capsule Take 10 mg by mouth daily.    [provider]  losartan (COZAAR) 50 MG tablet Take 1 tablet (50 mg total) by mouth daily. 01/27/19   Salary, Avel Peace, MD  Multiple Vitamins-Minerals (PRESERVISION AREDS 2+MULTI VIT) CAPS Take 2 capsules by mouth daily.    [provider]  pramipexole (MIRAPEX) 0.25 MG tablet Take 0.25 mg by mouth 2 (two) times daily.    [provider]  traMADol (ULTRAM) 50 MG tablet Take 1 tablet (50 mg total) by mouth every 6 (six) hours as needed for severe pain. 01/28/19   Salary, Avel Peace, MD  vitamin B-12 (CYANOCOBALAMIN) 1000 MCG tablet Take 1,000 mcg by mouth daily.    [provider]    Allergies Patient has no known allergies.  Family History  Problem Relation Age of Onset  . Colon cancer Mother   . Emphysema Father     Social History Social History   Tobacco Use  . Smoking status: Never Smoker  . Smokeless tobacco: Never Used  Substance Use  Topics  . Alcohol use: Not on file  . Drug use: Not on file    Review of Systems  Constitutional: Negative for fever. Eyes: Negative for visual changes. ENT: Negative for facial injury or neck injury Cardiovascular: Negative for chest injury. Respiratory: Negative for shortness of breath. Negative for chest wall injury. Gastrointestinal: Negative for abdominal pain or injury. Genitourinary: Negative for dysuria. Musculoskeletal: Negative for back injury, negative for arm or leg pain. Skin: Negative for laceration/abrasions. Neurological: + head  injury.   ____________________________________________   PHYSICAL EXAM:  VITAL SIGNS: ED Triage Vitals  Enc Vitals Group     BP 02/28/20 0117 (!) 189/70     Pulse Rate 02/28/20 0117 65     Resp 02/28/20 0117 20     Temp 02/28/20 0117 97.8 F (36.6 C)     Temp Source 02/28/20 0117 Oral     SpO2 02/28/20 0113 98 %     Weight 02/28/20 0118 125 lb (56.7 kg)     Height 02/28/20 0118 5\' 4"  (1.626 m)     Head Circumference --      Peak Flow --      Pain Score --      Pain Loc --      Pain Edu? --      Excl. in [place]? --     Full spinal precautions maintained throughout the trauma exam. Constitutional: Alert and oriented. No acute distress. Does not appear intoxicated. HEENT Head: Normocephalic with occipital hematoma. Face: No facial bony tenderness. Stable midface Ears: No hemotympanum bilaterally. No Battle sign Eyes: No eye injury. PERRL. No raccoon eyes Nose: Nontender. No epistaxis. No rhinorrhea Mouth/Throat: Mucous membranes are moist. No oropharyngeal blood. No dental injury. Airway patent without stridor. Normal voice. Neck: no C-collar. No midline c-spine tenderness.  Cardiovascular: Normal rate, regular rhythm. Normal and symmetric distal pulses are present in all extremities. Pulmonary/Chest: Chest wall is stable and nontender to palpation/compression. Normal respiratory effort. Breath sounds are normal. No crepitus.  Abdominal: Soft, nontender, non distended. Musculoskeletal: Mild swelling of the right knee with diffuse tenderness palpation.  Nontender with normal full range of motion in all other extremities. No deformities. No thoracic or lumbar midline spinal tenderness. Pelvis is stable. Skin: Skin is warm, dry and intact. No abrasions or contutions. Psychiatric: Speech and behavior are appropriate. Neurological: Normal speech and language. Moves all extremities to command. No gross focal neurologic deficits are appreciated.  Glascow Coma Score: 4 - Opens eyes on  own 6 - Follows simple motor commands 5 - Alert and oriented GCS: 15   ____________________________________________   LABS (all labs ordered are listed, but only abnormal results are displayed)  Labs Reviewed - No data to display ____________________________________________  EKG  ED ECG REPORT I, Rudene Re, the attending physician, personally viewed and interpreted this ECG.  Normal sinus rhythm, rate of 61, normal intervals, normal axis, no ST elevations or depressions.  Normal EKG. ____________________________________________  RADIOLOGY  I have personally reviewed the images performed during this visit and I agree with the Radiologist's read.   Interpretation by Radiologist:  CT Head Wo Contrast  Result Date: 02/28/2020 CLINICAL DATA:  86 year old female status post fall. EXAM: CT HEAD WITHOUT CONTRAST CT CERVICAL SPINE WITHOUT CONTRAST TECHNIQUE: Multidetector CT imaging of the head and cervical spine was performed following the standard protocol without intravenous contrast. Multiplanar CT image reconstructions of the cervical spine were also generated. COMPARISON:  Head CT 01/22/2019. Cervical spine CT 10/01/2006. FINDINGS: CT HEAD  FINDINGS Brain: Stable cerebral volume. No midline shift, mass effect, or evidence of intracranial mass lesion. No ventriculomegaly. No acute intracranial hemorrhage identified. Stable gray-white matter differentiation throughout the brain. No cortically based acute infarct identified. Vascular: Calcified atherosclerosis at the skull base. No suspicious intracranial vascular hyperdensity. Skull: Stable and intact. Sinuses/Orbits: Chronic paranasal sinus surgery. Visualized paranasal sinuses and mastoids are stable and well pneumatized. Other: Left posterior scalp broad-based hematoma (series 3, image 50). Underlying calvarium appears intact. No scalp soft tissue gas identified. No other scalp or orbits soft tissue injury. CT CERVICAL SPINE  FINDINGS Alignment: Increased degenerative appearing anterolisthesis of C3 on C4 since 2007, now 3 mm. Trace new anterolisthesis of C6 on C7. Stable similar anterolisthesis of C7 on T1, and bilateral cervicothoracic junction facet ankylosis has developed since 2007. Bilateral posterior element alignment is within normal limits. Skull base and vertebrae: Osteopenia. Visualized skull base is intact. No atlanto-occipital dissociation. Preserved C1-C2 alignment. No acute osseous abnormality identified. Soft tissues and spinal canal: No prevertebral fluid or swelling. No visible canal hematoma. Calcified left carotid atherosclerosis. Disc levels: C7-T1 facet ankylosis has developed since 2007. Cervical spine degeneration elsewhere without significant spinal stenosis. Upper chest: Osteopenia. Grossly intact visible upper thoracic levels. Negative lung apices. IMPRESSION: 1. Left posterior scalp hematoma without underlying skull fracture. 2. No other acute traumatic injury identified in the head or cervical spine. 3. Negative for age non contrast CT appearance of the brain. Electronically Signed   By: Genevie Ann M.D.   On: 02/28/2020 02:16   CT Cervical Spine Wo Contrast  Result Date: 02/28/2020 CLINICAL DATA:  86 year old female status post fall. EXAM: CT HEAD WITHOUT CONTRAST CT CERVICAL SPINE WITHOUT CONTRAST TECHNIQUE: Multidetector CT imaging of the head and cervical spine was performed following the standard protocol without intravenous contrast. Multiplanar CT image reconstructions of the cervical spine were also generated. COMPARISON:  Head CT 01/22/2019. Cervical spine CT 10/01/2006. FINDINGS: CT HEAD FINDINGS Brain: Stable cerebral volume. No midline shift, mass effect, or evidence of intracranial mass lesion. No ventriculomegaly. No acute intracranial hemorrhage identified. Stable gray-white matter differentiation throughout the brain. No cortically based acute infarct identified. Vascular: Calcified  atherosclerosis at the skull base. No suspicious intracranial vascular hyperdensity. Skull: Stable and intact. Sinuses/Orbits: Chronic paranasal sinus surgery. Visualized paranasal sinuses and mastoids are stable and well pneumatized. Other: Left posterior scalp broad-based hematoma (series 3, image 50). Underlying calvarium appears intact. No scalp soft tissue gas identified. No other scalp or orbits soft tissue injury. CT CERVICAL SPINE FINDINGS Alignment: Increased degenerative appearing anterolisthesis of C3 on C4 since 2007, now 3 mm. Trace new anterolisthesis of C6 on C7. Stable similar anterolisthesis of C7 on T1, and bilateral cervicothoracic junction facet ankylosis has developed since 2007. Bilateral posterior element alignment is within normal limits. Skull base and vertebrae: Osteopenia. Visualized skull base is intact. No atlanto-occipital dissociation. Preserved C1-C2 alignment. No acute osseous abnormality identified. Soft tissues and spinal canal: No prevertebral fluid or swelling. No visible canal hematoma. Calcified left carotid atherosclerosis. Disc levels: C7-T1 facet ankylosis has developed since 2007. Cervical spine degeneration elsewhere without significant spinal stenosis. Upper chest: Osteopenia. Grossly intact visible upper thoracic levels. Negative lung apices. IMPRESSION: 1. Left posterior scalp hematoma without underlying skull fracture. 2. No other acute traumatic injury identified in the head or cervical spine. 3. Negative for age non contrast CT appearance of the brain. Electronically Signed   By: Genevie Ann M.D.   On: 02/28/2020 02:16   DG Knee Complete  4 Views Right  Result Date: 02/28/2020 CLINICAL DATA:  86 year old female status post fall. EXAM: RIGHT KNEE - COMPLETE 4+ VIEW COMPARISON:  None. FINDINGS: Tricompartmental joint space loss and degenerative spurring with subchondral sclerosis. Possible small suprapatellar joint effusion on the lateral view. But no acute osseous  abnormality identified. IMPRESSION: 1. Possible small joint effusion but no acute fracture or dislocation identified. 2. Tricompartmental right knee joint degeneration. Electronically Signed   By: Genevie Ann M.D.   On: 02/28/2020 02:08   DG Hip Unilat W or Wo Pelvis 2-3 Views Right  Result Date: 02/28/2020 CLINICAL DATA:  86 year old female status post fall. EXAM: DG HIP (WITH OR WITHOUT PELVIS) 2-3V RIGHT COMPARISON:  CT Abdomen and Pelvis 05/23/2014. FINDINGS: Sequelae of inguinal hernia repair with mesh again noted. Femoral heads normally located. Pelvis and proximal left femur appear grossly intact. Chronic lower lumbar vacuum disc. Proximal right femur appears intact. No acute osseous abnormality identified. Negative visible bowel gas pattern. IMPRESSION: No acute fracture or dislocation identified about the right hip or pelvis. Electronically Signed   By: Genevie Ann M.D.   On: 02/28/2020 02:09     ____________________________________________   PROCEDURES  Procedure(s) performed: None Procedures Critical Care performed:  None ____________________________________________   INITIAL IMPRESSION / ASSESSMENT AND PLAN / ED COURSE   86 y.o. female with a history of falls, bradycardia, hypertension who presents from [place] after a mechanical fall.  Patient with an occipital hematoma with no laceration, no CT and L-spine tenderness.  Also complaining of right knee pain which is mildly swollen but with no deformities.  Fall was mechanical in nature.     As part of my medical decision making, I reviewed the following data within the electronic MEDICAL RECORD NUMBER CT head and c-spine and XR of pelvis, R hip and R knee were reviewed by me showing no acute traumatic injuries. I have  reviewed the EKG and looked at the rhythm strip in the room which showed NSR. I have reviewed patient's previous medical records and PMH. Patient given Tylenol for her pain.  Patient be discharged back to her facility on  supportive care.  Discussed my standard return precautions and follow-up with PCP.  Husband has been updated over the phone. Patient ambulatory in the ED with assistance.       ____________________________________________  Please note:  Patient was evaluated in Emergency Department today for the symptoms described in the history of present illness. Patient was evaluated in the context of the global COVID-19 pandemic, which necessitated consideration that the patient might be at risk for infection with the SARS-CoV-2 virus that causes COVID-19. Institutional protocols and algorithms that pertain to the evaluation of patients at risk for COVID-19 are in a state of rapid change based on information released by regulatory bodies including the CDC and federal and state organizations. These policies and algorithms were followed during the patient's care in the ED.  Some ED evaluations and interventions may be delayed as a result of limited staffing during the pandemic.   ____________________________________________   FINAL CLINICAL IMPRESSION(S) / ED DIAGNOSES   Final diagnoses:  Fall, initial encounter  Hematoma of occipital surface of head, initial encounter      NEW MEDICATIONS STARTED DURING THIS VISIT:  ED Discharge Orders    None       Note:  This document was prepared using Dragon voice recognition software and may include unintentional dictation errors.    Alfred Levins, [place], MD 02/28/20 [phone]

## 2020-02-28 NOTE — ED Notes (Signed)
Report given to [facility]. Patient left with EMS to return to facility.

## 2020-02-28 NOTE — ED Triage Notes (Signed)
Patient arrived to [facility] by [facility] from [facility] due to fall. Per EMS patient stood when getting out of bed and slipped and hit left back side of head. Patient alert to self and location.

## 2020-02-28 NOTE — Discharge Instructions (Addendum)

## 2020-02-28 NOTE — ED Notes (Signed)
This writer attempted to call facility multpile time with no answer. Husband aware of discharge to facility.

## 2020-04-04 ENCOUNTER — Emergency Department: Payer: Medicare Other

## 2020-04-04 ENCOUNTER — Other Ambulatory Visit: Payer: Self-pay

## 2020-04-04 ENCOUNTER — Emergency Department
Admission: EM | Admit: 2020-04-04 | Discharge: 2020-04-04 | Disposition: A | Discharge status: Home / Self Care | Payer: Medicare Other | Attending: Emergency Medicine | Admitting: Emergency Medicine

## 2020-04-04 DIAGNOSIS — Y939 Activity, unspecified: Secondary | ICD-10-CM | POA: Diagnosis not present

## 2020-04-04 DIAGNOSIS — S52124A Nondisplaced fracture of head of right radius, initial encounter for closed fracture: Secondary | ICD-10-CM | POA: Insufficient documentation

## 2020-04-04 DIAGNOSIS — Y92129 Unspecified place in nursing home as the place of occurrence of the external cause: Secondary | ICD-10-CM | POA: Diagnosis not present

## 2020-04-04 DIAGNOSIS — Y999 Unspecified external cause status: Secondary | ICD-10-CM | POA: Insufficient documentation

## 2020-04-04 DIAGNOSIS — I10 Essential (primary) hypertension: Secondary | ICD-10-CM | POA: Insufficient documentation

## 2020-04-04 DIAGNOSIS — S0990XA Unspecified injury of head, initial encounter: Secondary | ICD-10-CM

## 2020-04-04 DIAGNOSIS — S0101XA Laceration without foreign body of scalp, initial encounter: Secondary | ICD-10-CM

## 2020-04-04 DIAGNOSIS — W19XXXA Unspecified fall, initial encounter: Secondary | ICD-10-CM | POA: Diagnosis not present

## 2020-04-04 MED ORDER — ACETAMINOPHEN 325 MG PO TABS
650.0000 mg | ORAL_TABLET | Freq: Once | ORAL | Status: AC
  Administered 2020-04-04: 650 mg via ORAL
  Filled 2020-04-04: qty 2

## 2020-04-04 NOTE — ED Provider Notes (Signed)
[facility] Emergency Department Provider Note       Time seen: ----------------------------------------- 3:10 PM on 04/04/2020 -----------------------------------------   I have reviewed the triage vital signs and the nursing notes.  HISTORY   Chief Complaint Fall   HPI Paige Vasquez is a 86 y.o. female with a history of cystitis, GERD, hypertension, restless leg syndrome who presents to the ED for an unwitnessed fall at [facility].  Patient reportedly fell and hit her head.  She had a scalp laceration, is complaining of right elbow pain and bilateral knee pain.  Pain is 4 out of 10.  Past Medical History:  Diagnosis Date  . Cystitis   . GERD (gastroesophageal reflux disease)   . HTN (hypertension)   . RLS (restless legs syndrome)     Patient Active Problem List   Diagnosis Date Noted  . Bradycardia 01/22/2019  . UTI (urinary tract infection) 07/02/2018  . Falls 07/02/2018  . HTN (hypertension) 07/02/2018  . GERD (gastroesophageal reflux disease) 07/02/2018  . RLS (restless legs syndrome) 07/02/2018    Past Surgical History:  Procedure Laterality Date  . ABDOMINAL HYSTERECTOMY    . NASAL SINUS SURGERY      Allergies Patient has no known allergies.  Social History Social History   Tobacco Use  . Smoking status: Never Smoker  . Smokeless tobacco: Never Used  Substance Use Topics  . Alcohol use: Not on file  . Drug use: Not on file    Review of Systems Constitutional: Negative for fever. Cardiovascular: Negative for chest pain. Respiratory: Negative for shortness of breath. Gastrointestinal: Negative for abdominal pain, vomiting and diarrhea. Musculoskeletal: Positive for right elbow pain, knee pain Skin: Positive for scalp laceration Neurological: Positive for headache  All systems negative/normal/unremarkable except as stated in the HPI  ____________________________________________   PHYSICAL EXAM:  VITAL SIGNS: ED  Triage Vitals  Enc Vitals Group     BP 04/04/20 1502 (!) 153/62     Pulse Rate 04/04/20 1502 (!) 55     Resp 04/04/20 1502 19     Temp 04/04/20 1458 98.4 F (36.9 C)     Temp Source 04/04/20 1458 Oral     SpO2 04/04/20 1500 95 %     Weight 04/04/20 1505 115 lb (52.2 kg)     Height 04/04/20 1505 5\' 4"  (1.626 m)     Head Circumference --      Peak Flow --      Pain Score --      Pain Loc --      Pain Edu? --      Excl. in [place]? --     Constitutional: Alert but disoriented, no acute distress Eyes: Conjunctivae are normal. Normal extraocular movements. ENT      Head: Normocephalic, midline parietal scalp laceration of 2.5 cm      Nose: No congestion/rhinnorhea.      Mouth/Throat: Mucous membranes are moist.      Neck: No stridor. Cardiovascular: Normal rate, regular rhythm. No murmurs, rubs, or gallops. Respiratory: Normal respiratory effort without tachypnea nor retractions. Breath sounds are clear and equal bilaterally. No wheezes/rales/rhonchi. Gastrointestinal: Soft and nontender. Normal bowel sounds Musculoskeletal: Pain with range of motion of the right elbow and bilateral knees Neurologic:  Normal speech and language. No gross focal neurologic deficits are appreciated.  Skin: Mild bleeding coming from midline scalp laceration of 2.5 cm Psychiatric: Mood and affect are normal. Speech and behavior are normal.  ____________________________________________  EKG: Interpreted by me.  Sinus rhythm with baseline artifact, rate is 70 bpm, normal PR interval, normal QRS, normal QT  ____________________________________________  ED COURSE:  As part of my medical decision making, I reviewed the following data within the [place] History obtained from family if available, nursing notes, old chart and ekg, as well as notes from prior ED visits. Patient presented for unwitnessed fall, we will assess with labs and imaging as indicated at this time.   Marland Kitchen.Laceration  Repair  Date/Time: 04/04/2020 3:13 PM Performed by: Earleen Newport, MD Authorized by: Earleen Newport, MD   Consent:    Consent obtained:  Verbal   Consent given by:  Patient Anesthesia (see MAR for exact dosages):    Anesthesia method:  None Laceration details:    Location:  Scalp   Scalp location:  Crown   Length (cm):  2.5   Depth (mm):  5 Repair type:    Repair type:  Simple Treatment:    Area cleansed with:  Saline   Amount of cleaning:  Standard   Irrigation solution:  Tap water   Irrigation method:  Tap   Visualized foreign bodies/material removed: no   Skin repair:    Repair method:  Staples   Number of staples:  3 Approximation:    Approximation:  Close Post-procedure details:    Dressing:  Open (no dressing)   Patient tolerance of procedure:  Tolerated well, no immediate complications    Paige Vasquez was evaluated in Emergency Department on 04/04/2020 for the symptoms described in the history of present illness. She was evaluated in the context of the global COVID-19 pandemic, which necessitated consideration that the patient might be at risk for infection with the SARS-CoV-2 virus that causes COVID-19. Institutional protocols and algorithms that pertain to the evaluation of patients at risk for COVID-19 are in a state of rapid change based on information released by regulatory bodies including the CDC and federal and state organizations. These policies and algorithms were followed during the patient's care in the ED.  ____________________________________________   LABS (pertinent positives/negatives)  Labs Reviewed - No data to display  RADIOLOGY Images were viewed by me  CT head, right elbow x-ray, bilateral knee x-rays IMPRESSION:  1. No acute osseous abnormality.  2. Mild to moderate tricompartmental degenerative changes with  apparent medial greater than lateral femorotibial compartmental  narrowing incompletely assessed on nonweightbearing  radiograph.  3. Suspect at least small suprapatellar effusion.  4. Atherosclerosis.  IMPRESSION:  1. No acute fracture or traumatic malalignment.  2. Small suprapatellar joint effusion and mild knee swelling.  3. 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  ____________________________________________   DIFFERENTIAL DIAGNOSIS   Fall, contusion, fracture, subdural, laceration  FINAL ASSESSMENT AND PLAN  Fall, head injury, scalp laceration, radial head fracture    Plan: The patient had presented for a fall. Patient's imaging revealed a possible nondisplaced radial head fracture.  X-rays of the knee showed arthritis, CT head was unremarkable.  She will be placed in a Ace wrap because of not sure she can tolerate a posterior long-arm splint.  She will be referred to orthopedics for close outpatient follow-up.  Staple removal in 10 days.   Laurence Aly, MD    Note: This note was generated in part or whole with voice recognition software. Voice recognition is usually quite accurate but there are transcription errors that can and very often do occur. I apologize for any typographical errors that were not detected and corrected.  Earleen Newport, MD 04/04/20 [phone]

## 2020-04-04 NOTE — ED Triage Notes (Signed)
Pt from [facility] via EMS.  Fall, unwitnessed. 1 inch laceration to crown of head. No c/o pain from head wound. Pt has dried blood from head lac in hair and on neck.  C/o chronic knee pain, new c/o elbow pain to EMS.  VS per EMS: BP: 122/53; HR 62; 95% on Room air.

## 2020-04-04 NOTE — ED Notes (Signed)
Pt transported to CT ?

## 2020-04-04 NOTE — ED Notes (Signed)
Pt incontinent of stool and urine. Pt assisted to bedpan for urges of having a BM.

## 2020-04-04 NOTE — ED Notes (Addendum)
Pt with laceration to posterior top of head. Lac stapled by Dr. Jimmye Norman, 3 staples placed. Bleeding controlled, pt's hair and scalped cleaned.

## 2020-04-04 NOTE — ED Notes (Signed)
Called [facility] for transport to [facility]

## 2020-04-28 ENCOUNTER — Emergency Department: Payer: Medicare Other

## 2020-04-28 ENCOUNTER — Observation Stay
Admission: EM | Admit: 2020-04-28 | Discharge: 2020-04-30 | Payer: Medicare Other | Attending: Internal Medicine | Admitting: Internal Medicine

## 2020-04-28 ENCOUNTER — Other Ambulatory Visit: Payer: Self-pay

## 2020-04-28 DIAGNOSIS — Z20822 Contact with and (suspected) exposure to covid-19: Secondary | ICD-10-CM | POA: Diagnosis not present

## 2020-04-28 DIAGNOSIS — Z79899 Other long term (current) drug therapy: Secondary | ICD-10-CM | POA: Diagnosis not present

## 2020-04-28 DIAGNOSIS — I82409 Acute embolism and thrombosis of unspecified deep veins of unspecified lower extremity: Secondary | ICD-10-CM | POA: Diagnosis present

## 2020-04-28 DIAGNOSIS — I82402 Acute embolism and thrombosis of unspecified deep veins of left lower extremity: Secondary | ICD-10-CM

## 2020-04-28 DIAGNOSIS — I82432 Acute embolism and thrombosis of left popliteal vein: Secondary | ICD-10-CM | POA: Diagnosis not present

## 2020-04-28 DIAGNOSIS — M7989 Other specified soft tissue disorders: Secondary | ICD-10-CM | POA: Diagnosis present

## 2020-04-28 DIAGNOSIS — D72829 Elevated white blood cell count, unspecified: Secondary | ICD-10-CM | POA: Diagnosis not present

## 2020-04-28 DIAGNOSIS — G2581 Restless legs syndrome: Secondary | ICD-10-CM | POA: Diagnosis not present

## 2020-04-28 DIAGNOSIS — I824Y2 Acute embolism and thrombosis of unspecified deep veins of left proximal lower extremity: Secondary | ICD-10-CM | POA: Diagnosis not present

## 2020-04-28 DIAGNOSIS — I10 Essential (primary) hypertension: Secondary | ICD-10-CM | POA: Insufficient documentation

## 2020-04-28 DIAGNOSIS — R197 Diarrhea, unspecified: Secondary | ICD-10-CM | POA: Insufficient documentation

## 2020-04-28 DIAGNOSIS — I82412 Acute embolism and thrombosis of left femoral vein: Principal | ICD-10-CM | POA: Insufficient documentation

## 2020-04-28 DIAGNOSIS — K219 Gastro-esophageal reflux disease without esophagitis: Secondary | ICD-10-CM | POA: Insufficient documentation

## 2020-04-28 DIAGNOSIS — Z66 Do not resuscitate: Secondary | ICD-10-CM | POA: Insufficient documentation

## 2020-04-28 DIAGNOSIS — F418 Other specified anxiety disorders: Secondary | ICD-10-CM | POA: Insufficient documentation

## 2020-04-28 DIAGNOSIS — F0390 Unspecified dementia without behavioral disturbance: Secondary | ICD-10-CM | POA: Diagnosis not present

## 2020-04-28 LAB — CBC WITH DIFFERENTIAL/PLATELET
Abs Immature Granulocytes: 0.06 10*3/uL (ref 0.00–0.07)
Basophils Absolute: 0.1 10*3/uL (ref 0.0–0.1)
Basophils Relative: 0 %
Eosinophils Absolute: 0.1 10*3/uL (ref 0.0–0.5)
Eosinophils Relative: 0 %
HCT: 40.8 % (ref 36.0–46.0)
Hemoglobin: 13.2 g/dL (ref 12.0–15.0)
Immature Granulocytes: 1 %
Lymphocytes Relative: 8 %
Lymphs Abs: 0.9 10*3/uL (ref 0.7–4.0)
MCH: 28.3 pg (ref 26.0–34.0)
MCHC: 32.4 g/dL (ref 30.0–36.0)
MCV: 87.4 fL (ref 80.0–100.0)
Monocytes Absolute: 0.8 10*3/uL (ref 0.1–1.0)
Monocytes Relative: 7 %
Neutro Abs: 9.8 10*3/uL — ABNORMAL HIGH (ref 1.7–7.7)
Neutrophils Relative %: 84 %
Platelets: 219 10*3/uL (ref 150–400)
RBC: 4.67 MIL/uL (ref 3.87–5.11)
RDW: 13.8 % (ref 11.5–15.5)
WBC: 11.6 10*3/uL — ABNORMAL HIGH (ref 4.0–10.5)
nRBC: 0 % (ref 0.0–0.2)

## 2020-04-28 LAB — COMPREHENSIVE METABOLIC PANEL
ALT: 12 U/L (ref 0–44)
AST: 20 U/L (ref 15–41)
Albumin: 3.5 g/dL (ref 3.5–5.0)
Alkaline Phosphatase: 89 U/L (ref 38–126)
Anion gap: 8 (ref 5–15)
BUN: 16 mg/dL (ref 8–23)
CO2: 27 mmol/L (ref 22–32)
Calcium: 9.2 mg/dL (ref 8.9–10.3)
Chloride: 100 mmol/L (ref 98–111)
Creatinine, Ser: 0.85 mg/dL (ref 0.44–1.00)
GFR calc Af Amer: >60 mL/min (ref >60)
GFR calc non Af Amer: >60 mL/min (ref >60)
Glucose, Bld: 106 mg/dL — ABNORMAL HIGH (ref 70–99)
Potassium: 4.3 mmol/L (ref 3.5–5.1)
Sodium: 135 mmol/L (ref 135–145)
Total Bilirubin: 0.8 mg/dL (ref 0.3–1.2)
Total Protein: 8.3 g/dL — ABNORMAL HIGH (ref 6.5–8.1)

## 2020-04-28 LAB — GASTROINTESTINAL PANEL BY PCR, STOOL (REPLACES STOOL CULTURE)

## 2020-04-28 LAB — HEPARIN LEVEL (UNFRACTIONATED)
Heparin Unfractionated: 1.4 IU/mL — ABNORMAL HIGH (ref 0.30–0.70)
Heparin Unfractionated: 0.43 IU/mL (ref 0.30–0.70)

## 2020-04-28 LAB — RESPIRATORY PANEL BY RT PCR (FLU A&B, COVID)
Influenza A by PCR: NEGATIVE
Influenza B by PCR: NEGATIVE
SARS Coronavirus 2 by RT PCR: NEGATIVE

## 2020-04-28 LAB — LACTIC ACID, PLASMA: Lactic Acid, Venous: 1 mmol/L (ref 0.5–1.9)

## 2020-04-28 LAB — C DIFFICILE QUICK SCREEN W PCR REFLEX
C Diff antigen: NEGATIVE
C Diff interpretation: NOT DETECTED
C Diff toxin: NEGATIVE

## 2020-04-28 LAB — APTT: aPTT: >160 seconds (ref 24–36)

## 2020-04-28 MED ORDER — FLUOXETINE HCL 20 MG PO CAPS
20.0000 mg | ORAL_CAPSULE | Freq: Every day | ORAL | Status: DC
  Administered 2020-04-28 – 2020-04-30 (×3): 20 mg via ORAL
  Filled 2020-04-28 (×3): qty 1

## 2020-04-28 MED ORDER — VITAMIN D3 25 MCG (1000 UNIT) PO TABS
2000.0000 [IU] | ORAL_TABLET | Freq: Every day | ORAL | Status: DC
  Administered 2020-04-29 – 2020-04-30 (×2): 2000 [IU] via ORAL
  Filled 2020-04-28 (×4): qty 2

## 2020-04-28 MED ORDER — MORPHINE SULFATE (PF) 2 MG/ML IV SOLN
0.5000 mg | INTRAVENOUS | Status: DC | PRN
  Administered 2020-04-28 – 2020-04-29 (×3): 0.5 mg via INTRAVENOUS
  Filled 2020-04-28 (×3): qty 1

## 2020-04-28 MED ORDER — FERROUS SULFATE 325 (65 FE) MG PO TABS
325.0000 mg | ORAL_TABLET | Freq: Every day | ORAL | Status: DC
  Administered 2020-04-28 – 2020-04-30 (×3): 325 mg via ORAL
  Filled 2020-04-28 (×4): qty 1

## 2020-04-28 MED ORDER — GABAPENTIN 100 MG PO CAPS
100.0000 mg | ORAL_CAPSULE | Freq: Every day | ORAL | Status: DC
  Administered 2020-04-28 – 2020-04-29 (×2): 100 mg via ORAL
  Filled 2020-04-28 (×2): qty 1

## 2020-04-28 MED ORDER — HYDRALAZINE HCL 20 MG/ML IJ SOLN
5.0000 mg | INTRAMUSCULAR | Status: DC | PRN
  Administered 2020-04-28: 5 mg via INTRAVENOUS
  Filled 2020-04-28: qty 1

## 2020-04-28 MED ORDER — POLYETHYLENE GLYCOL 3350 17 G PO PACK: 17.0000 g | PACK | Freq: Every day | ORAL | Status: DC | PRN

## 2020-04-28 MED ORDER — HEPARIN BOLUS VIA INFUSION
3000.0000 [IU] | Freq: Once | INTRAVENOUS | Status: AC
  Administered 2020-04-28: 3000 [IU] via INTRAVENOUS
  Filled 2020-04-28: qty 3000

## 2020-04-28 MED ORDER — OXYCODONE-ACETAMINOPHEN 5-325 MG PO TABS
1.0000 | ORAL_TABLET | ORAL | Status: DC | PRN
  Administered 2020-04-28: 1 via ORAL
  Filled 2020-04-28 (×2): qty 1

## 2020-04-28 MED ORDER — PRAMIPEXOLE DIHYDROCHLORIDE 0.25 MG PO TABS
0.2500 mg | ORAL_TABLET | Freq: Two times a day (BID) | ORAL | Status: DC
  Administered 2020-04-28 – 2020-04-30 (×4): 0.25 mg via ORAL
  Filled 2020-04-28 (×4): qty 1

## 2020-04-28 MED ORDER — VITAMIN B-12 1000 MCG PO TABS
1000.0000 ug | ORAL_TABLET | Freq: Every day | ORAL | Status: DC
  Administered 2020-04-29 – 2020-04-30 (×2): 1000 ug via ORAL
  Filled 2020-04-28 (×2): qty 1

## 2020-04-28 MED ORDER — MELATONIN 5 MG PO TABS
5.0000 mg | ORAL_TABLET | Freq: Every day | ORAL | Status: DC
  Administered 2020-04-28 – 2020-04-29 (×2): 5 mg via ORAL
  Filled 2020-04-28 (×2): qty 1

## 2020-04-28 MED ORDER — QUETIAPINE FUMARATE 25 MG PO TABS
50.0000 mg | ORAL_TABLET | Freq: Three times a day (TID) | ORAL | Status: DC
  Administered 2020-04-28 – 2020-04-30 (×6): 50 mg via ORAL
  Filled 2020-04-28 (×6): qty 2

## 2020-04-28 MED ORDER — DILTIAZEM HCL ER COATED BEADS 180 MG PO CP24
180.0000 mg | ORAL_CAPSULE | Freq: Every day | ORAL | Status: DC
  Administered 2020-04-29: 180 mg via ORAL
  Filled 2020-04-28 (×2): qty 1

## 2020-04-28 MED ORDER — TRAZODONE HCL 50 MG PO TABS
25.0000 mg | ORAL_TABLET | Freq: Two times a day (BID) | ORAL | Status: DC | PRN
  Administered 2020-04-28: 25 mg via ORAL
  Filled 2020-04-28: qty 1

## 2020-04-28 MED ORDER — ONDANSETRON HCL 4 MG/2ML IJ SOLN: 4.0000 mg | Freq: Three times a day (TID) | INTRAMUSCULAR | Status: DC | PRN

## 2020-04-28 MED ORDER — ACETAMINOPHEN 325 MG PO TABS
650.0000 mg | ORAL_TABLET | Freq: Four times a day (QID) | ORAL | Status: DC | PRN
  Administered 2020-04-30: 650 mg via ORAL
  Filled 2020-04-28: qty 2

## 2020-04-28 MED ORDER — HEPARIN (PORCINE) 25000 UT/250ML-% IV SOLN
850.0000 [IU]/h | INTRAVENOUS | Status: AC
  Administered 2020-04-28: 850 [IU]/h via INTRAVENOUS
  Filled 2020-04-28: qty 250

## 2020-04-28 NOTE — ED Notes (Signed)
Pt cleaned of stool. Samples obtained for lab. Ready for transport to floor at this time.

## 2020-04-28 NOTE — ED Notes (Signed)
Pt called out for bm. Pt cleaned and new brief in place. Call button within reach.

## 2020-04-28 NOTE — Progress Notes (Signed)
ANTICOAGULATION CONSULT NOTE - Initial Consult  Pharmacy Consult for Heparin Drip Indication: DVT  No Known Allergies  Patient Measurements: Height: 5\' 4"  (162.6 cm) Weight: 53 kg (116 lb 13.5 oz) IBW/kg (Calculated) : 54.7 Heparin Dosing Weight: 53 kg  Vital Signs: Temp: 98.5 F (36.9 C) (05/08 2013) Temp Source: Oral (05/08 2013) BP: 93/53 (05/08 2036) Pulse Rate: 72 (05/08 2036)  Labs: Recent Labs    04/28/20 0837 04/28/20 1253 04/28/20 1948  HGB 13.2  --   --   HCT 40.8  --   --   PLT 219  --   --   APTT  --  >160*  --   HEPARINUNFRC  --  1.40* 0.43  CREATININE 0.85  --   --     Estimated Creatinine Clearance: 36.8 mL/min (by C-G formula based on SCr of 0.85 mg/dL).   Medical History: Past Medical History:  Diagnosis Date  . Cystitis   . GERD (gastroesophageal reflux disease)   . HTN (hypertension)   . RLS (restless legs syndrome)    Assessment: Patient is a 86yo female admitted for DVT. Pharmacy consulted for Heparin dosing. Noted that patient had Apixaban 2.5mg  # 20 tablets filled in 1/21 but none since. Per Med History tech none listed on MAR from nursing facility.  Goal of Therapy:  Heparin level 0.3-0.7 units/ml Monitor platelets by anticoagulation protocol: Yes   Plan:  Give 3000 units bolus x 1 Start heparin infusion at 850 units/hr Check anti-Xa level in 8 hours and daily while on heparin Continue to monitor H&H and platelets   5/8:  HL @ 1948 = 0.43 Will continue pt on current rate and recheck HL in 8 hrs on 5/9 @ 0400.   Chasta Deshpande D 04/28/2020 8:42 PM

## 2020-04-28 NOTE — Progress Notes (Signed)
ANTICOAGULATION CONSULT NOTE - Initial Consult  Pharmacy Consult for Heparin Drip Indication: DVT  No Known Allergies  Patient Measurements: Height: 5\' 4"  (162.6 cm) Weight: 53 kg (116 lb 13.5 oz) IBW/kg (Calculated) : 54.7 Heparin Dosing Weight: 53 kg  Vital Signs: Temp: 98.4 F (36.9 C) (05/08 0834) Temp Source: Oral (05/08 0834) BP: 161/107 (05/08 1230) Pulse Rate: 81 (05/08 1230)  Labs: Recent Labs    04/28/20 0837  HGB 13.2  HCT 40.8  PLT 219  CREATININE 0.85    Estimated Creatinine Clearance: 36.8 mL/min (by C-G formula based on SCr of 0.85 mg/dL).   Medical History: Past Medical History:  Diagnosis Date  . Cystitis   . GERD (gastroesophageal reflux disease)   . HTN (hypertension)   . RLS (restless legs syndrome)    Assessment: Patient is a 86yo female admitted for DVT. Pharmacy consulted for Heparin dosing. Noted that patient had Apixaban 2.5mg  # 20 tablets filled in 1/21 but none since. Per Med History tech none listed on MAR from nursing facility.  Goal of Therapy:  Heparin level 0.3-0.7 units/ml Monitor platelets by anticoagulation protocol: Yes   Plan:  Give 3000 units bolus x 1 Start heparin infusion at 850 units/hr Check anti-Xa level in 8 hours and daily while on heparin Continue to monitor H&H and platelets  Paulina Fusi, PharmD, BCPS 04/28/2020 1:40 PM

## 2020-04-28 NOTE — ED Notes (Signed)
Pt hit call bell stating she needed to have BM. Pt placed on bedpan to have BM. Brown BM noted. Cleaned and put in dry brief.

## 2020-04-28 NOTE — Discharge Planning (Signed)
[facility]  Patient name: Paige Vasquez MRN: [id number] DOB: 06/10/1927 Sex: female   REQUESTING PROVIDER:   ER   REASON FOR CONSULT:    DVT  HISTORY OF PRESENT ILLNESS:   Paige Vasquez is a 86 y.o. female, who presented to the emergency department with redness and swelling of her left leg for the past several days.  The patient suffers from dementia and is by herself in the room and so additional history cannot be obtained.  The patient does have a history of frequent falls with no acute trauma noted  PAST MEDICAL HISTORY    Past Medical History:  Diagnosis Date  . Cystitis   . GERD (gastroesophageal reflux disease)   . HTN (hypertension)   . RLS (restless legs syndrome)      FAMILY HISTORY   Family History  Problem Relation Age of Onset  . Colon cancer Mother   . Emphysema Father     SOCIAL HISTORY:   Social History   Socioeconomic History  . Marital status: Married    Spouse name: Not on file  . Number of children: Not on file  . Years of education: Not on file  . Highest education level: Not on file  Occupational History  . Not on file  Tobacco Use  . Smoking status: Never Smoker  . Smokeless tobacco: Never Used  Substance and Sexual Activity  . Alcohol use: Not on file  . Drug use: Not on file  . Sexual activity: Not on file  Other Topics Concern  . Not on file  Social History Narrative  . Not on file   Social Determinants of Health   Financial Resource Strain:   . Difficulty of Paying Living Expenses:   Food Insecurity:   . Worried About Charity fundraiser in the Last Year:   . Arboriculturist in the Last Year:   Transportation Needs:   . Film/video editor (Medical):   Marland Kitchen Lack of Transportation (Non-Medical):   Physical Activity:   . Days of Exercise per Week:   . Minutes of Exercise per Session:   Stress:   . Feeling of Stress :   Social Connections:   .  Frequency of Communication with Friends and Family:   . Frequency of Social Gatherings with Friends and Family:   . Attends Religious Services:   . Active Member of Clubs or Organizations:   . Attends Archivist Meetings:   Marland Kitchen Marital Status:   Intimate Partner Violence:   . Fear of Current or Ex-Partner:   . Emotionally Abused:   Marland Kitchen Physically Abused:   . Sexually Abused:     ALLERGIES:    No Known Allergies  CURRENT MEDICATIONS:    Current Facility-Administered Medications  Medication Dose Route Frequency Provider Last Rate Last Admin  . acetaminophen (TYLENOL) tablet 650 mg  650 mg Oral Q6H PRN Ivor Costa, MD      . Derrill Memo ON 04/29/2020] cholecalciferol (VITAMIN D) tablet 2,000 Units  2,000 Units Oral Daily Ivor Costa, MD      . Derrill Memo ON 04/29/2020] diltiazem (CARDIZEM CD) 24 hr capsule 180 mg  180 mg Oral Daily Ivor Costa, MD      . ferrous sulfate tablet 325 mg  325 mg Oral Daily Ivor Costa, MD   325 mg at 04/28/20 1513  . FLUoxetine (PROZAC) capsule 20 mg  20 mg Oral Daily Ivor Costa, MD   20 mg  at 04/28/20 1513  . gabapentin (NEURONTIN) capsule 100 mg  100 mg Oral QHS Ivor Costa, MD      . heparin ADULT infusion 100 units/mL (25000 units/270mL sodium chloride 0.45%)  850 Units/hr Intravenous Continuous Ivor Costa, MD 8.5 mL/hr at 04/28/20 1504 850 Units/hr at 04/28/20 1504  . hydrALAZINE (APRESOLINE) injection 5 mg  5 mg Intravenous Q2H PRN Ivor Costa, MD   5 mg at 04/28/20 1513  . melatonin tablet 5 mg  5 mg Oral QHS Ivor Costa, MD      . ondansetron [facility]) injection 4 mg  4 mg Intravenous Q8H PRN Ivor Costa, MD      . oxyCODONE-acetaminophen (PERCOCET/ROXICET) 5-325 MG per tablet 1 tablet  1 tablet Oral Q4H PRN Ivor Costa, MD      . polyethylene glycol (MIRALAX / GLYCOLAX) packet 17 g  17 g Oral Daily PRN Ivor Costa, MD      . pramipexole (MIRAPEX) tablet 0.25 mg  0.25 mg Oral BID Ivor Costa, MD      . QUEtiapine (SEROQUEL) tablet 50 mg  50 mg Oral TID Ivor Costa,  MD   50 mg at 04/28/20 1513  . traZODone (DESYREL) tablet 25 mg  25 mg Oral Q12H PRN Ivor Costa, MD      . Derrill Memo ON 04/29/2020] vitamin B-12 (CYANOCOBALAMIN) tablet 1,000 mcg  1,000 mcg Oral Daily Ivor Costa, MD        REVIEW OF SYSTEMS:   [X]  denotes positive finding, [ ]  denotes negative finding Cardiac  Comments:  Chest pain or chest pressure:    Shortness of breath upon exertion:    Short of breath when lying flat:    Irregular heart rhythm:        Vascular    Pain in calf, thigh, or hip brought on by ambulation:    Pain in feet at night that wakes you up from your sleep:     Blood clot in your veins:    Leg swelling:         Pulmonary    Oxygen at home:    Productive cough:     Wheezing:         Neurologic    Sudden weakness in arms or legs:     Sudden numbness in arms or legs:     Sudden onset of difficulty speaking or slurred speech:    Temporary loss of vision in one eye:     Problems with dizziness:         Gastrointestinal    Blood in stool:      Vomited blood:         Genitourinary    Burning when urinating:     Blood in urine:        Psychiatric    Major depression:         Hematologic    Bleeding problems:    Problems with blood clotting too easily:        Skin    Rashes or ulcers:        Constitutional    Fever or chills:     PHYSICAL EXAM:   Vitals:   04/28/20 1030 04/28/20 1100 04/28/20 1230 04/28/20 1418  BP: (!) 143/72 (!) 151/59 (!) 161/107 (!) 165/63  Pulse: 70 (!) 57 81 (!) 58  Resp:  18  18  Temp:    98.5 F (36.9 C)  TempSrc:      SpO2: 95% 99% 95% 94%  Weight:  Height:        GENERAL: The patient is a well-nourished female, in no acute distress. The vital signs are documented above. CARDIAC: There is a regular rate and rhythm.  VASCULAR: Palpable left dorsalis pedis pulse.  The left leg is erythematous and edematous PULMONARY: Nonlabored respirations ABDOMEN: Soft and non-tender with normal pitched bowel sounds.    MUSCULOSKELETAL: There are no major deformities or cyanosis. NEUROLOGIC: No focal weakness or paresthesias are detected. SKIN: There are no ulcers or rashes noted.  STUDIES:   I have reviewed her ultrasound with the following results: 1. Positive for extensive acute, occlusive left lower extremity DVT beginning in the distal common femoral vein and extending throughout the leg into the popliteal vein. ASSESSMENT and PLAN   Left leg DVT: The patient does not have evidence of phlegmasia but rather just a swollen erythematous leg.  She has palpable pedal pulses and appears to have normal range of motion.  Unfortunately the patient is demented and cannot provide answers to questions.  Because of her mental capacity, I do not think she is a good candidate for intervention.  Because of her age and frailty, she should probably be admitted for IV heparin and leg elevation with plans for conversion to oral anticoagulation once she proves that she can tolerate being anticoagulated.  If she cannot be anticoagulated, she potentially would be a candidate for a filter   Annamarie Major, IV, MD, FACS [facility] [phone] Pager [phone]

## 2020-04-28 NOTE — ED Notes (Signed)
Pt endorses bm. Brief removed and pt cleaned. New brief in place.

## 2020-04-28 NOTE — ED Triage Notes (Signed)
Pt arrives via EMS from [place] after staff noticed increased swelling and redness in the left leg that started about 2 days ago- pt was here about 1 month ago for a fall and has her right arm wrapped- per EMS pt not taking blood thinners- pt able to move left foot and toes freely

## 2020-04-28 NOTE — ED Notes (Signed)
US at bedside

## 2020-04-28 NOTE — Progress Notes (Signed)
RN notified MD pt was complaining she had to urinate but she was not able to do it. RN bladder scan pt and show it had over 300 ml of urine. Per MD okay for RN to in and out the pt.

## 2020-04-28 NOTE — H&P (Signed)
History and Physical    Paige Vasquez [id number] DOB: 04-29-1927 DOA: 04/28/2020  Referring MD/NP/PA:   PCP: Rusty Aus, MD   Patient coming from:  The patient is coming from SNF.  At baseline, pt is dependent for most of ADL.        Chief Complaint: left leg swelling   HPI: Paige Vasquez is a 86 y.o. female with medical history significant of hypertension, GERD, depression with anxiety, RLS, cystitis, bradycardia, dementia, who presents with left leg swelling.  Per report, pt has has been having left leg swelling, pain and redness for more than 2 days. Per report, pt had fall about 1 month ago and has her right arm wrapped. She had negative CT-head on 03/25/20. Pt was noted to have 2 episodes of diarrhea in the ED.  Patient has dementia, cannot provide any accurate history, but she has obvious pain in the left leg.  Denies chest pain.  No active nausea vomiting noted.  No respiratory distress, cough or shortness of breath noted.  She moves all extremities.  ED Course: pt was found to have WBC 11.6, lactic acid 1.0, electrolytes renal function okay, pending COVID-19 PCR, temperature normal, blood pressure 143/72, heart rate 55, oxygen saturation 95% on room air. left leg venous Doppler showed extensive acute, occlusive left lower extremity DVT beginning in the distal common femoral vein and extending throughout the leg into the popliteal vein.  Patient is placed on MedSurg bed for observation.  Vascular surgeon, Dr. Trula Slade is consulted.   Review of Systems: Could not be reviewed accurately due to dementia.  Allergy: No Known Allergies  Past Medical History:  Diagnosis Date  . Cystitis   . GERD (gastroesophageal reflux disease)   . HTN (hypertension)   . RLS (restless legs syndrome)     Past Surgical History:  Procedure Laterality Date  . ABDOMINAL HYSTERECTOMY    . NASAL SINUS SURGERY      Social History:  reports that she has never smoked. She has never used smokeless  tobacco. No history on file for alcohol and drug.  Family History:  Family History  Problem Relation Age of Onset  . Colon cancer Mother   . Emphysema Father      Prior to Admission medications   Medication Sig Start Date End Date Taking? Authorizing Provider  acetaminophen (TYLENOL) 325 MG tablet Take 2 tablets (650 mg total) by mouth every 6 (six) hours as needed for mild pain (or Fever >/= 101). 07/05/18   Dustin Flock, MD  acetaminophen (TYLENOL) 500 MG tablet Take 500-1,000 mg by mouth every 6 (six) hours as needed for mild pain or fever.    [provider]  ALPRAZolam Duanne Moron) 0.25 MG tablet Take 1 tablet (0.25 mg total) by mouth 2 (two) times daily as needed for anxiety. 01/28/19   Salary, Avel Peace, MD  cholecalciferol (VITAMIN D) 25 MCG (1000 UT) tablet Take 1,000 Units by mouth daily.    [provider]  Cranberry-Vitamin C-Probiotic (AZO CRANBERRY) 250-30 MG TABS Take 1 tablet by mouth 2 (two) times daily.    [provider]  FLUoxetine (PROZAC) 10 MG capsule Take 10 mg by mouth daily.    [provider]  losartan (COZAAR) 50 MG tablet Take 1 tablet (50 mg total) by mouth daily. 01/27/19   Salary, Avel Peace, MD  Multiple Vitamins-Minerals (PRESERVISION AREDS 2+MULTI VIT) CAPS Take 2 capsules by mouth daily.    [provider]  pramipexole (MIRAPEX) 0.25  MG tablet Take 0.25 mg by mouth 2 (two) times daily.    [provider]  traMADol (ULTRAM) 50 MG tablet Take 1 tablet (50 mg total) by mouth every 6 (six) hours as needed for severe pain. 01/28/19   Salary, Avel Peace, MD  vitamin B-12 (CYANOCOBALAMIN) 1000 MCG tablet Take 1,000 mcg by mouth daily.    [provider]    Physical Exam: Vitals:   04/28/20 1030 04/28/20 1100 04/28/20 1230 04/28/20 1418  BP: (!) 143/72 (!) 151/59 (!) 161/107 (!) 165/63  Pulse: 70 (!) 57 81 (!) 58  Resp:  18  18  Temp:    98.5 F (36.9 C)  TempSrc:      SpO2: 95% 99% 95% 94%  Weight:       Height:       General: Not in acute distress HEENT:       Eyes: PERRL, EOMI, no scleral icterus.       ENT: No discharge from the ears and nose, no pharynx injection, no tonsillar enlargement.        Neck: No JVD, no bruit, no mass felt. Heme: No neck lymph node enlargement. Cardiac: S1/S2, RRR, No murmurs, No gallops or rubs. Respiratory:  No rales, wheezing, rhonchi or rubs. GI: Soft, nondistended, nontender, no rebound pain, no organomegaly, BS present. GU: No hematuria Ext: has left leg swelling, tenderness and redness. 1+DP/PT pulse bilaterally. Musculoskeletal: No joint deformities, No joint redness or warmth, no limitation of ROM in spin. Skin: No rashes.  Neuro: Alert, cranial nerves II-XII grossly intact, moves all extremities. Psych: Patient is not psychotic, no suicidal or hemocidal ideation.  Labs on Admission: I have personally reviewed following labs and imaging studies  CBC: Recent Labs  Lab 04/28/20 0837  WBC 11.6*  NEUTROABS 9.8*  HGB 13.2  HCT 40.8  MCV 87.4  PLT [id number]   Basic Metabolic Panel: Recent Labs  Lab 04/28/20 0837  NA 135  K 4.3  CL 100  CO2 27  GLUCOSE 106*  BUN 16  CREATININE 0.85  CALCIUM 9.2   GFR: Estimated Creatinine Clearance: 36.8 mL/min (by C-G formula based on SCr of 0.85 mg/dL). Liver Function Tests: Recent Labs  Lab 04/28/20 0837  AST 20  ALT 12  ALKPHOS 89  BILITOT 0.8  PROT 8.3*  ALBUMIN 3.5   No results for input(s): LIPASE, AMYLASE in the last 168 hours. No results for input(s): AMMONIA in the last 168 hours. Coagulation Profile: No results for input(s): INR, PROTIME in the last 168 hours. Cardiac Enzymes: No results for input(s): CKTOTAL, CKMB, CKMBINDEX, TROPONINI in the last 168 hours. BNP (last 3 results) No results for input(s): PROBNP in the last 8760 hours. HbA1C: No results for input(s): HGBA1C in the last 72 hours. CBG: No results for input(s): GLUCAP in the last 168 hours. Lipid Profile: No  results for input(s): CHOL, HDL, LDLCALC, TRIG, CHOLHDL, LDLDIRECT in the last 72 hours. Thyroid Function Tests: No results for input(s): TSH, T4TOTAL, FREET4, T3FREE, THYROIDAB in the last 72 hours. Anemia Panel: No results for input(s): VITAMINB12, FOLATE, FERRITIN, TIBC, IRON, RETICCTPCT in the last 72 hours. Urine analysis:    Component Value Date/Time   COLORURINE STRAW (A) 01/22/2019 1714   APPEARANCEUR CLEAR (A) 01/22/2019 1714   LABSPEC 1.008 01/22/2019 1714   PHURINE 6.0 01/22/2019 1714   GLUCOSEU NEGATIVE 01/22/2019 1714   HGBUR NEGATIVE 01/22/2019 1714   BILIRUBINUR NEGATIVE 01/22/2019 1714   KETONESUR NEGATIVE 01/22/2019 1714   PROTEINUR  NEGATIVE 01/22/2019 1714   NITRITE NEGATIVE 01/22/2019 1714   LEUKOCYTESUR MODERATE (A) 01/22/2019 1714   Sepsis Labs: @LABRCNTIP (procalcitonin:4,lacticidven:4) ) Recent Results (from the past 240 hour(s))  Respiratory Panel by RT PCR (Flu A&B, Covid) - Nasopharyngeal Swab     Status: None   Collection Time: 04/28/20  8:38 AM   Specimen: Nasopharyngeal Swab  Result Value Ref Range Status   SARS Coronavirus 2 by RT PCR NEGATIVE NEGATIVE Final    Comment: (NOTE) SARS-CoV-2 target nucleic acids are NOT DETECTED. The SARS-CoV-2 RNA is generally detectable in upper respiratoy specimens during the acute phase of infection. The lowest concentration of SARS-CoV-2 viral copies this assay can detect is 131 copies/mL. A negative result does not preclude SARS-Cov-2 infection and should not be used as the sole basis for treatment or other patient management decisions. A negative result may occur with  improper specimen collection/handling, submission of specimen other than nasopharyngeal swab, presence of viral mutation(s) within the areas targeted by this assay, and inadequate number of viral copies (<131 copies/mL). A negative result must be combined with clinical observations, patient history, and epidemiological information. The expected  result is Negative. Fact Sheet for Patients:  PinkCheek.be Fact Sheet for Healthcare Providers:  GravelBags.it This test is not yet ap proved or cleared by the [place] FDA and  has been authorized for detection and/or diagnosis of SARS-CoV-2 by FDA under an Emergency Use Authorization (EUA). This EUA will remain  in effect (meaning this test can be used) for the duration of the COVID-19 declaration under Section 564(b)(1) of the Act, 21 U.S.C. section 360bbb-3(b)(1), unless the authorization is terminated or revoked sooner.    Influenza A by PCR NEGATIVE NEGATIVE Final   Influenza B by PCR NEGATIVE NEGATIVE Final    Comment: (NOTE) The Xpert Xpress SARS-CoV-2/FLU/RSV assay is intended as an aid in  the diagnosis of influenza from Nasopharyngeal swab specimens and  should not be used as a sole basis for treatment. Nasal washings and  aspirates are unacceptable for Xpert Xpress SARS-CoV-2/FLU/RSV  testing. Fact Sheet for Patients: PinkCheek.be Fact Sheet for Healthcare Providers: GravelBags.it This test is not yet approved or cleared by the [place] FDA and  has been authorized for detection and/or diagnosis of SARS-CoV-2 by  FDA under an Emergency Use Authorization (EUA). This EUA will remain  in effect (meaning this test can be used) for the duration of the  Covid-19 declaration under Section 564(b)(1) of the Act, 21  U.S.C. section 360bbb-3(b)(1), unless the authorization is  terminated or revoked. Performed at [facility], [place], [place] [address]   C Difficile Quick Screen w PCR reflex     Status: None   Collection Time: 04/28/20  1:46 PM   Specimen: STOOL  Result Value Ref Range Status   C Diff antigen NEGATIVE NEGATIVE Final   C Diff toxin NEGATIVE NEGATIVE Final   C Diff interpretation No C. difficile detected.  Final     Comment: Performed at [facility], [address] [place] [address]  Gastrointestinal Panel by PCR , Stool     Status: None   Collection Time: 04/28/20  1:46 PM   Specimen: Stool  Result Value Ref Range Status   Campylobacter species NOT DETECTED NOT DETECTED Final   Plesimonas shigelloides NOT DETECTED NOT DETECTED Final   Salmonella species NOT DETECTED NOT DETECTED Final   Yersinia enterocolitica NOT DETECTED NOT DETECTED Final   Vibrio species NOT DETECTED NOT DETECTED  Final   Vibrio cholerae NOT DETECTED NOT DETECTED Final   Enteroaggregative E coli (EAEC) NOT DETECTED NOT DETECTED Final   Enteropathogenic E coli (EPEC) NOT DETECTED NOT DETECTED Final   Enterotoxigenic E coli (ETEC) NOT DETECTED NOT DETECTED Final   Shiga like toxin producing E coli (STEC) NOT DETECTED NOT DETECTED Final   Shigella/Enteroinvasive E coli (EIEC) NOT DETECTED NOT DETECTED Final   Cryptosporidium NOT DETECTED NOT DETECTED Final   Cyclospora cayetanensis NOT DETECTED NOT DETECTED Final   Entamoeba histolytica NOT DETECTED NOT DETECTED Final   Giardia lamblia NOT DETECTED NOT DETECTED Final   Adenovirus F40/41 NOT DETECTED NOT DETECTED Final   Astrovirus NOT DETECTED NOT DETECTED Final   Norovirus GI/GII NOT DETECTED NOT DETECTED Final   Rotavirus A NOT DETECTED NOT DETECTED Final   Sapovirus (I, II, IV, and V) NOT DETECTED NOT DETECTED Final    Comment: Performed at [facility] [address] [place], [place] [address]     Radiological Exams on Admission: US Venous Img Lower Unilateral Left  Result Date: 04/28/2020 CLINICAL DATA:  86 year old female with left lower extremity swelling and redness for the past 2 days EXAM: LEFT LOWER EXTREMITY VENOUS DOPPLER ULTRASOUND TECHNIQUE: Gray-scale sonography with graded compression, as well as color Doppler and duplex ultrasound were performed to evaluate the lower extremity deep venous systems from the level of the common  femoral vein and including the common femoral, femoral, profunda femoral, popliteal and calf veins including the posterior tibial, peroneal and gastrocnemius veins when visible. The superficial great saphenous vein was also interrogated. Spectral Doppler was utilized to evaluate flow at rest and with distal augmentation maneuvers in the common femoral, femoral and popliteal veins. COMPARISON:  None. FINDINGS: Contralateral Common Femoral Vein: Respiratory phasicity is normal and symmetric with the symptomatic side. No evidence of thrombus. Normal compressibility. Common Femoral Vein: Abnormal. The vessel is not compressible in the lumen is expanded and filled with low-level internal echoes. No evidence of flow on color Doppler imaging. Findings are consistent with acute occlusive DVT. Saphenofemoral Junction: Occlusive and proximal DVT extends into the saphenofemoral junction great saphenous vein. Profunda Femoral Vein: Occlusive thrombus extends into the proximal profunda femoral vein. Femoral Vein: Occlusive thrombus extends throughout the femoral vein in the thigh. Popliteal Vein: Occlusive thrombus extends into the popliteal vein. Calf Veins: No evidence of thrombus. Normal compressibility and flow on color Doppler imaging. Superficial Great Saphenous Vein: No evidence of thrombus. Normal compressibility. Venous Reflux:  None. Other Findings:  None. IMPRESSION: 1. Positive for extensive acute, occlusive left lower extremity DVT beginning in the distal common femoral vein and extending throughout the leg into the popliteal vein. Electronically Signed   By: Jacqulynn Cadet M.D.   On: 04/28/2020 10:12     EKG:   Not done in ED, will get one.   Assessment/Plan Principal Problem:   DVT (deep venous thrombosis) (HCC) Active Problems:   HTN (hypertension)   RLS (restless legs syndrome)   Leukocytosis   Depression with anxiety   Diarrhea   DVT (deep venous thrombosis) (HCC) -Left leg.  left leg venous  Doppler showed extensive acute, occlusive left lower extremity DVT beginning in the distal common femoral vein and extending throughout the leg into the popliteal vein.  Patient is placed on MedSurg bed for observation.  Vascular surgeon, Dr. Trula Slade is consulted.  -place on med-surg bed for obs -IV heparin -prn Percocet and morphine -elevation of left leg -f/u VVS recommendations  HTN:  -  Continue home medications: Cardizem -hydralazine prn  RLS (restless legs syndrome) -Pramipexole  Leukocytosis: WBC 11.6, mild leukocytosis, no fever.  Low suspicions for cellulitis.  No other source of infection identified.  Likely reactive -Follow-up with CBC -Follow-up blood culture  Depression and anxiety: Stable, no suicidal or homicidal ideations. -Continue home medications: Prozac, Seroquel, trazodone    DVT ppx: On IV Heparin  Code Status: DNR per family Family Communication: Yes, patient's husband and daughter by phone Disposition Plan:  Anticipate discharge back to previous SNF  Consults called:  VVS, Dr. Trula Slade Admission status: Med-surg bed for obs     Status is: Observation  The patient remains OBS appropriate and will d/c before 2 midnights.  Dispo: The patient is from: SNF              Anticipated d/c is to: SNF              Anticipated d/c date is: 1 day              Patient currently is not medically stable to d/c.           Date of Service 04/28/2020    [facility]   If 7PM-7AM, please contact night-coverage [email or web address] 04/28/2020, 4:14 PM

## 2020-04-28 NOTE — ED Provider Notes (Signed)
[facility] Emergency Department Provider Note ____________________________________________   First MD Initiated Contact with Patient 04/28/20 0845     (approximate)  I have reviewed the triage vital signs and the nursing notes.   HISTORY  Chief Complaint Leg Swelling  Level 5 caveat: History of present illness limited due to dementia  HPI [name] is a 86 y.o. female with PMH as noted below who presents with left leg swelling and redness over the last few days.  The patient is unable to give any specific history.  There is no history of trauma to the area, although the patient does have frequent falls.  Past Medical History:  Diagnosis Date  . Cystitis   . GERD (gastroesophageal reflux disease)   . HTN (hypertension)   . RLS (restless legs syndrome)     Patient Active Problem List   Diagnosis Date Noted  . DVT (deep venous thrombosis) ([place]) 04/28/2020  . Leukocytosis 04/28/2020  . Depression with anxiety 04/28/2020  . Bradycardia 01/22/2019  . UTI (urinary tract infection) 07/02/2018  . Falls 07/02/2018  . HTN (hypertension) 07/02/2018  . GERD (gastroesophageal reflux disease) 07/02/2018  . RLS (restless legs syndrome) 07/02/2018    Past Surgical History:  Procedure Laterality Date  . ABDOMINAL HYSTERECTOMY    . NASAL SINUS SURGERY      Prior to Admission medications   Medication Sig Start Date End Date Taking? Authorizing Provider  cholecalciferol (VITAMIN D) 25 MCG (1000 UT) tablet Take 2,000 Units by mouth daily.    Yes [provider]  diltiazem (CARDIZEM CD) 180 MG 24 hr capsule Take 180 mg by mouth daily.   Yes [provider]  ferrous sulfate 325 (65 FE) MG tablet Take 325 mg by mouth daily.   Yes [provider]  FLUoxetine (PROZAC) 20 MG tablet Take 20 mg by mouth daily.    Yes [provider]  gabapentin (NEURONTIN) 100 MG capsule Take 100 mg by mouth at bedtime.   Yes [provider]  melatonin 3 MG TABS tablet Take 6 mg by mouth at bedtime.   Yes [provider]  NONFORMULARY OR COMPOUNDED ITEM Apply 1 application topically in the morning, at noon, and at bedtime. ** LORAZEPAM 1mg /ml topical gel**   Yes [provider]  polyethylene glycol (MIRALAX / GLYCOLAX) 17 g packet Take 17 g by mouth daily.   Yes [provider]  pramipexole (MIRAPEX) 0.25 MG tablet Take 0.25 mg by mouth 2 (two) times daily.   Yes [provider]  QUEtiapine (SEROQUEL) 50 MG tablet Take 50 mg by mouth 3 (three) times daily.   Yes [provider]  traMADol (ULTRAM) 50 MG tablet Take 50 mg by mouth daily in the afternoon.   Yes [provider]  traMADol (ULTRAM) 50 MG tablet Take 100 mg by mouth daily.   Yes [provider]  vitamin B-12 (CYANOCOBALAMIN) 1000 MCG tablet Take 1,000 mcg by mouth daily.   Yes [provider]  acetaminophen (TYLENOL) 325 MG tablet Take 2 tablets (650 mg total) by mouth every 6 (six) hours as needed for mild pain (or Fever >/= 101). 07/05/18   Dustin Flock, MD  acetaminophen (TYLENOL) 500 MG tablet Take 500-1,000 mg by mouth every 6 (six) hours as needed for mild pain or fever.    [provider]  ALPRAZolam Duanne Moron) 0.25 MG tablet Take 1 tablet (0.25 mg total) by mouth 2 (two) times daily as needed for anxiety.  01/28/19   Salary, Avel Peace, MD  Cranberry-Vitamin C-Probiotic (AZO CRANBERRY) 250-30 MG TABS Take 1 tablet by mouth 2 (two) times daily.    [provider]  losartan (COZAAR) 50 MG tablet Take 1 tablet (50 mg total) by mouth daily. 01/27/19   Salary, Avel Peace, MD  Multiple Vitamins-Minerals (PRESERVISION AREDS 2+MULTI VIT) CAPS Take 2 capsules by mouth daily.    [provider]    Allergies Patient has no known allergies.  Family History  Problem Relation Age of Onset  . Colon cancer Mother   . Emphysema Father     Social History Social History    Tobacco Use  . Smoking status: Never Smoker  . Smokeless tobacco: Never Used  Substance Use Topics  . Alcohol use: Not on file  . Drug use: Not on file    Review of Systems Level 5 caveat: Unable to obtain review of systems due to dementia    ____________________________________________   PHYSICAL EXAM:  VITAL SIGNS: ED Triage Vitals  Enc Vitals Group     BP 04/28/20 0834 (!) 150/50     Pulse Rate 04/28/20 0834 61     Resp 04/28/20 0834 18     Temp 04/28/20 0834 98.4 F (36.9 C)     Temp Source 04/28/20 0834 Oral     SpO2 04/28/20 0834 98 %     Weight 04/28/20 0835 116 lb 13.5 oz (53 kg)     Height 04/28/20 0835 5\' 4"  (1.626 m)     Head Circumference --      Peak Flow --      Pain Score 04/28/20 0834 0     Pain Loc --      Pain Edu? --      Excl. in [place]? --     Constitutional: Alert, oriented x1.  Comfortable appearing, in no acute distress. Eyes: Conjunctivae are normal.  Head: Atraumatic. Nose: No congestion/rhinnorhea. Mouth/Throat: Mucous membranes are moist.   Neck: Normal range of motion.  Cardiovascular: Normal rate, regular rhythm.  Good peripheral circulation. Respiratory: Normal respiratory effort.  No retractions. Gastrointestinal: No distention.  Musculoskeletal: Extremities warm and well perfused.  Left lower extremity with edema and erythema from the foot to the proximal thigh especially posteriorly.  Mild induration and increased warmth.  Full range of motion at all joints. Neurologic:  Normal speech and language.  Motor intact in all extremities. Skin:  Skin is warm and dry. No rash noted except as above to left lower extremity. Psychiatric: Calm and cooperative.  ____________________________________________   LABS (all labs ordered are listed, but only abnormal results are displayed)  Labs Reviewed  COMPREHENSIVE METABOLIC PANEL - Abnormal; Notable for the following components:      Result Value   Glucose, Bld 106 (*)    Total Protein  8.3 (*)    All other components within normal limits  CBC WITH DIFFERENTIAL/PLATELET - Abnormal; Notable for the following components:   WBC 11.6 (*)    Neutro Abs 9.8 (*)    All other components within normal limits  RESPIRATORY PANEL BY RT PCR (FLU A&B, COVID)  LACTIC ACID, PLASMA   ____________________________________________  EKG   ____________________________________________  RADIOLOGY  US venous LLE: Extensive acute DVT from the common femoral vein to popliteal  ____________________________________________   PROCEDURES  Procedure(s) performed: No  Procedures  Critical Care performed: No ____________________________________________   INITIAL IMPRESSION / ASSESSMENT AND PLAN / ED COURSE  Pertinent labs & imaging results that were  available during my care of the patient were reviewed by me and considered in my medical decision making (see chart for details).  86 year old female with a history of dementia and other PMH as noted above presents with left lower extremity swelling and redness for the last few days.  The patient is unable to give any additional history.  I reviewed the past medical records in [place].  The patient has been seen in ED several times previously for falls, most recently last month in which she had a right radial head fracture and had x-rays of the knees bilaterally showing no acute fracture.  On exam, the patient is overall relatively comfortable appearing.  Her vital signs are normal except for mild hypertension.  The left lower extremity is swollen from the foot all the way to the proximal thigh with mild erythema and warmth over this whole area, gradually fading towards the proximal thigh.  She has full range of motion all joints and no open wounds or ulcers.  Overall presentation is concerning for cellulitis.  Given the extent of the involvement, differential also includes DVT or less likely peripheral edema or lymphedema.  We will obtain an  ultrasound, lab work-up, and reassess.  Given the size of the area, if this is consistent with cellulitis the patient will need admission for IV antibiotics and further monitoring.  ----------------------------------------- 10:57 AM on 04/28/2020 -----------------------------------------  Lab work-up is unremarkable except for mild leukocytosis.  The ultrasound shows extensive acute DVT from the common femoral vein through the popliteal.  I consulted Dr. Trula Slade from vascular surgery.  He recommends that given the patient's age and the size of the DVT as well as the acuity, he would recommend admission and anticoagulation with heparin.  He does not recommend any acute intervention at this time, however his team would consider intervention based on if the symptoms improve.  I then discussed the case with the hospitalist Dr. Blaine Hamper for admission. ____________________________________________   FINAL CLINICAL IMPRESSION(S) / ED DIAGNOSES  Final diagnoses:  Acute deep vein thrombosis (DVT) of left lower extremity, unspecified vein (HCC)      NEW MEDICATIONS STARTED DURING THIS VISIT:  New Prescriptions   No medications on file     Note:  This document was prepared using Dragon voice recognition software and may include unintentional dictation errors.    Arta Silence, MD 04/28/20 1058

## 2020-04-29 DIAGNOSIS — I10 Essential (primary) hypertension: Secondary | ICD-10-CM | POA: Diagnosis not present

## 2020-04-29 DIAGNOSIS — I82412 Acute embolism and thrombosis of left femoral vein: Secondary | ICD-10-CM | POA: Diagnosis not present

## 2020-04-29 DIAGNOSIS — I82402 Acute embolism and thrombosis of unspecified deep veins of left lower extremity: Secondary | ICD-10-CM | POA: Diagnosis not present

## 2020-04-29 LAB — CBC
HCT: 35.1 % — ABNORMAL LOW (ref 36.0–46.0)
Hemoglobin: 11.4 g/dL — ABNORMAL LOW (ref 12.0–15.0)
MCH: 28.2 pg (ref 26.0–34.0)
MCHC: 32.5 g/dL (ref 30.0–36.0)
MCV: 86.9 fL (ref 80.0–100.0)
Platelets: 200 10*3/uL (ref 150–400)
RBC: 4.04 MIL/uL (ref 3.87–5.11)
RDW: 13.6 % (ref 11.5–15.5)
WBC: 9.9 10*3/uL (ref 4.0–10.5)
nRBC: 0 % (ref 0.0–0.2)

## 2020-04-29 LAB — HEPARIN LEVEL (UNFRACTIONATED): Heparin Unfractionated: 0.39 IU/mL (ref 0.30–0.70)

## 2020-04-29 MED ORDER — HEPARIN (PORCINE) 25000 UT/250ML-% IV SOLN: 850.0000 [IU]/h | INTRAVENOUS | Status: DC

## 2020-04-29 MED ORDER — APIXABAN 5 MG PO TABS
10.0000 mg | ORAL_TABLET | Freq: Two times a day (BID) | ORAL | Status: DC
  Administered 2020-04-29: 10 mg via ORAL
  Filled 2020-04-29: qty 2

## 2020-04-29 MED ORDER — APIXABAN 5 MG PO TABS: 5.0000 mg | ORAL_TABLET | Freq: Two times a day (BID) | ORAL | Status: DC

## 2020-04-29 MED ORDER — APIXABAN 5 MG PO TABS
10.0000 mg | ORAL_TABLET | Freq: Two times a day (BID) | ORAL | Status: DC
  Administered 2020-04-29 – 2020-04-30 (×2): 10 mg via ORAL
  Filled 2020-04-29 (×2): qty 2

## 2020-04-29 NOTE — Progress Notes (Signed)
Tele-sitter removed at 0900 am.

## 2020-04-29 NOTE — Progress Notes (Signed)
ANTICOAGULATION CONSULT NOTE - Initial Consult  Pharmacy Consult for Heparin Drip Indication: DVT  No Known Allergies  Patient Measurements: Height: 5\' 4"  (162.6 cm) Weight: 53 kg (116 lb 13.5 oz) IBW/kg (Calculated) : 54.7 Heparin Dosing Weight: 53 kg  Vital Signs: Temp: 99.2 F (37.3 C) (05/09 0517) Temp Source: Oral (05/09 0517) BP: 115/96 (05/09 1108) Pulse Rate: 77 (05/09 0517)  Labs: Recent Labs    04/28/20 0837 04/28/20 1253 04/28/20 1948 04/29/20 0429  HGB 13.2  --   --  11.4*  HCT 40.8  --   --  35.1*  PLT 219  --   --  200  APTT  --  >160*  --   --   HEPARINUNFRC  --  1.40* 0.43 0.39  CREATININE 0.85  --   --   --     Estimated Creatinine Clearance: 36.8 mL/min (by C-G formula based on SCr of 0.85 mg/dL).   Medical History: Past Medical History:  Diagnosis Date  . Cystitis   . GERD (gastroesophageal reflux disease)   . HTN (hypertension)   . RLS (restless legs syndrome)    Assessment: Patient is a 86yo female admitted for DVT. Pharmacy consulted for Heparin dosing. Noted that patient had Apixaban 2.5mg  # 20 tablets filled in 1/21 but none since. Per Med History tech none listed on MAR from nursing facility.  5/9 AM: Heparin infusion DCd and patient started on apixaban 10mg  BID for 7 days.   5/9 Afternoon- Per MD Vascular Surgery now considering surgery. Apixaban Dcd and patient changed back to heparin infusion.PAtient received apixaban 10mg  5/9 @ ~1115   Goal of Therapy:  Heparin level 0.3-0.7 units/ml Monitor platelets by anticoagulation protocol: Yes   Plan:  Will restart heparin infusion ~ 12 hours after apixaban dose. Heparin ordered to start @ 2330 5/9.  Will start heparin infusion @ rate of 850 units/hr (Patient's HL was therapeutic on this rate prior to DC).  Will need to dose by aPTT levels until aPTT and heparin level correlate. APTT/HL/CBC ordered @ 0730 5/10 AM.  Continue to monitor Hgb and Plts.   Pernell Dupre, PharmD,  BCPS Clinical Pharmacist 04/29/2020 2:13 PM

## 2020-04-29 NOTE — Progress Notes (Signed)
PROGRESS NOTE    Paige Vasquez  TSV:[id number] DOB: Jun 18, 1927 DOA: 04/28/2020 PCP: Rusty Aus, MD   Brief Narrative:  Patient is a 86 year old female with history of hypertension, depression anxiety, dementia and acid reflex who presents to the emergency room with a left leg swelling. Ultrasound showed occlusive left lower extremity DVT. She is evaluated by a vascular surgeon, no need for surgery. She is placed on heparin drip.   Assessment & Plan:   Principal Problem:   DVT (deep venous thrombosis) (HCC) Active Problems:   HTN (hypertension)   RLS (restless legs syndrome)   Leukocytosis   Depression with anxiety   Diarrhea  #1. Left lower extremity DVT. No evidence of PE. No need for surgery. I will discontinue heparin after 24 hours, and change to Eliquis. Patient can be discharged back to nursing home tomorrow.  2. Essential hypertension. Continue home medicines per  3. Dementia. Follow.   DVT prophylaxis: Eliquis Code Status: DNR Family Communication: None in room. Disposition Plan:  . Patient came from: ECF            . Anticipated d/c place:ECF . Barriers to d/c OR conditions which need to be met to effect a safe d/c:   Consultants:   Vascular surgery  Procedures: None Antimicrobials: None Subjective: Patient has some confusion. She has left lower extremity edema. Denies any chest pain or shortness of breath. No nausea vomiting or diarrhea. No abdominal pain. No dysuria hematuria.  Objective: Vitals:   04/28/20 2013 04/28/20 2036 04/29/20 0517 04/29/20 1108  BP: (!) 103/55 (!) 93/53 (!) 107/55 (!) 115/96  Pulse: (!) 157 72 77   Resp: (!) '22 20 20   '$ Temp: 98.5 F (36.9 C)  99.2 F (37.3 C)   TempSrc: Axillary  Oral   SpO2: 95% 95% 90%   Weight:      Height:        Intake/Output Summary (Last 24 hours) at 04/29/2020 1232 Last data filed at 04/29/2020 0543 Gross per 24 hour  Intake 26.04 ml  Output 700 ml  Net -673.96 ml   Filed Weights    04/28/20 0835  Weight: 53 kg    Examination:  General exam: Appears calm and comfortable  Respiratory system: Clear to auscultation. Respiratory effort normal. Cardiovascular system: S1 & S2 heard, RRR. No JVD, murmurs, rubs, gallops or clicks. Left lower extremity edema and some tenderness. Gastrointestinal system: Abdomen is nondistended, soft and nontender. No organomegaly or masses felt. Normal bowel sounds heard. Central nervous system: Alert and oriented x2. No focal neurological deficits. Extremities: Symmetric 5 x 5 power. Skin: No rashes, lesions or ulcers Psychiatry: Judgement and insight appear normal. Mood & affect appropriate.     Data Reviewed: I have personally reviewed following labs and imaging studies  CBC: Recent Labs  Lab 04/28/20 0837 04/29/20 0429  WBC 11.6* 9.9  NEUTROABS 9.8*  --   HGB 13.2 11.4*  HCT 40.8 35.1*  MCV 87.4 86.9  PLT 219 923   Basic Metabolic Panel: Recent Labs  Lab 04/28/20 0837  NA 135  K 4.3  CL 100  CO2 27  GLUCOSE 106*  BUN 16  CREATININE 0.85  CALCIUM 9.2   GFR: Estimated Creatinine Clearance: 36.8 mL/min (by C-G formula based on SCr of 0.85 mg/dL). Liver Function Tests: Recent Labs  Lab 04/28/20 0837  AST 20  ALT 12  ALKPHOS 89  BILITOT 0.8  PROT 8.3*  ALBUMIN 3.5   No results for input(s): LIPASE,  AMYLASE in the last 168 hours. No results for input(s): AMMONIA in the last 168 hours. Coagulation Profile: No results for input(s): INR, PROTIME in the last 168 hours. Cardiac Enzymes: No results for input(s): CKTOTAL, CKMB, CKMBINDEX, TROPONINI in the last 168 hours. BNP (last 3 results) No results for input(s): PROBNP in the last 8760 hours. HbA1C: No results for input(s): HGBA1C in the last 72 hours. CBG: No results for input(s): GLUCAP in the last 168 hours. Lipid Profile: No results for input(s): CHOL, HDL, LDLCALC, TRIG, CHOLHDL, LDLDIRECT in the last 72 hours. Thyroid Function Tests: No results for  input(s): TSH, T4TOTAL, FREET4, T3FREE, THYROIDAB in the last 72 hours. Anemia Panel: No results for input(s): VITAMINB12, FOLATE, FERRITIN, TIBC, IRON, RETICCTPCT in the last 72 hours. Sepsis Labs: Recent Labs  Lab 04/28/20 2229  LATICACIDVEN 1.0    Recent Results (from the past 240 hour(s))  Respiratory Panel by RT PCR (Flu A&B, Covid) - Nasopharyngeal Swab     Status: None   Collection Time: 04/28/20  8:38 AM   Specimen: Nasopharyngeal Swab  Result Value Ref Range Status   SARS Coronavirus 2 by RT PCR NEGATIVE NEGATIVE Final    Comment: (NOTE) SARS-CoV-2 target nucleic acids are NOT DETECTED. The SARS-CoV-2 RNA is generally detectable in upper respiratoy specimens during the acute phase of infection. The lowest concentration of SARS-CoV-2 viral copies this assay can detect is 131 copies/mL. A negative result does not preclude SARS-Cov-2 infection and should not be used as the sole basis for treatment or other patient management decisions. A negative result may occur with  improper specimen collection/handling, submission of specimen other than nasopharyngeal swab, presence of viral mutation(s) within the areas targeted by this assay, and inadequate number of viral copies (<131 copies/mL). A negative result must be combined with clinical observations, patient history, and epidemiological information. The expected result is Negative. Fact Sheet for Patients:  PinkCheek.be Fact Sheet for Healthcare Providers:  GravelBags.it This test is not yet ap proved or cleared by the [place] FDA and  has been authorized for detection and/or diagnosis of SARS-CoV-2 by FDA under an Emergency Use Authorization (EUA). This EUA will remain  in effect (meaning this test can be used) for the duration of the COVID-19 declaration under Section 564(b)(1) of the Act, 21 U.S.C. section 360bbb-3(b)(1), unless the authorization is terminated  or revoked sooner.    Influenza A by PCR NEGATIVE NEGATIVE Final   Influenza B by PCR NEGATIVE NEGATIVE Final    Comment: (NOTE) The Xpert Xpress SARS-CoV-2/FLU/RSV assay is intended as an aid in  the diagnosis of influenza from Nasopharyngeal swab specimens and  should not be used as a sole basis for treatment. Nasal washings and  aspirates are unacceptable for Xpert Xpress SARS-CoV-2/FLU/RSV  testing. Fact Sheet for Patients: PinkCheek.be Fact Sheet for Healthcare Providers: GravelBags.it This test is not yet approved or cleared by the [place] FDA and  has been authorized for detection and/or diagnosis of SARS-CoV-2 by  FDA under an Emergency Use Authorization (EUA). This EUA will remain  in effect (meaning this test can be used) for the duration of the  Covid-19 declaration under Section 564(b)(1) of the Act, 21  U.S.C. section 360bbb-3(b)(1), unless the authorization is  terminated or revoked. Performed at [facility], [address], [place], [place] [address]   C Difficile Quick Screen w PCR reflex     Status: None   Collection Time: 04/28/20  1:46 PM   Specimen: STOOL  Result Value Ref Range Status   C Diff antigen NEGATIVE NEGATIVE Final   C Diff toxin NEGATIVE NEGATIVE Final   C Diff interpretation No C. difficile detected.  Final    Comment: Performed at [facility], [place] [place], [place] [address]  Gastrointestinal Panel by PCR , Stool     Status: None   Collection Time: 04/28/20  1:46 PM   Specimen: Stool  Result Value Ref Range Status   Campylobacter species NOT DETECTED NOT DETECTED Final   Plesimonas shigelloides NOT DETECTED NOT DETECTED Final   Salmonella species NOT DETECTED NOT DETECTED Final   Yersinia enterocolitica NOT DETECTED NOT DETECTED Final   Vibrio species NOT DETECTED NOT DETECTED Final   Vibrio cholerae NOT DETECTED NOT DETECTED Final    Enteroaggregative E coli (EAEC) NOT DETECTED NOT DETECTED Final   Enteropathogenic E coli (EPEC) NOT DETECTED NOT DETECTED Final   Enterotoxigenic E coli (ETEC) NOT DETECTED NOT DETECTED Final   Shiga like toxin producing E coli (STEC) NOT DETECTED NOT DETECTED Final   Shigella/Enteroinvasive E coli (EIEC) NOT DETECTED NOT DETECTED Final   Cryptosporidium NOT DETECTED NOT DETECTED Final   Cyclospora cayetanensis NOT DETECTED NOT DETECTED Final   Entamoeba histolytica NOT DETECTED NOT DETECTED Final   Giardia lamblia NOT DETECTED NOT DETECTED Final   Adenovirus F40/41 NOT DETECTED NOT DETECTED Final   Astrovirus NOT DETECTED NOT DETECTED Final   Norovirus GI/GII NOT DETECTED NOT DETECTED Final   Rotavirus A NOT DETECTED NOT DETECTED Final   Sapovirus (I, II, IV, and V) NOT DETECTED NOT DETECTED Final    Comment: Performed at [facility], [address] [place], [place] [address]         Radiology Studies: US Venous Img Lower Unilateral Left  Result Date: 04/28/2020 CLINICAL DATA:  86 year old female with left lower extremity swelling and redness for the past 2 days EXAM: LEFT LOWER EXTREMITY VENOUS DOPPLER ULTRASOUND TECHNIQUE: Gray-scale sonography with graded compression, as well as color Doppler and duplex ultrasound were performed to evaluate the lower extremity deep venous systems from the level of the common femoral vein and including the common femoral, femoral, profunda femoral, popliteal and calf veins including the posterior tibial, peroneal and gastrocnemius veins when visible. The superficial great saphenous vein was also interrogated. Spectral Doppler was utilized to evaluate flow at rest and with distal augmentation maneuvers in the common femoral, femoral and popliteal veins. COMPARISON:  None. FINDINGS: Contralateral Common Femoral Vein: Respiratory phasicity is normal and symmetric with the symptomatic side. No evidence of thrombus. Normal compressibility. Common  Femoral Vein: Abnormal. The vessel is not compressible in the lumen is expanded and filled with low-level internal echoes. No evidence of flow on color Doppler imaging. Findings are consistent with acute occlusive DVT. Saphenofemoral Junction: Occlusive and proximal DVT extends into the saphenofemoral junction great saphenous vein. Profunda Femoral Vein: Occlusive thrombus extends into the proximal profunda femoral vein. Femoral Vein: Occlusive thrombus extends throughout the femoral vein in the thigh. Popliteal Vein: Occlusive thrombus extends into the popliteal vein. Calf Veins: No evidence of thrombus. Normal compressibility and flow on color Doppler imaging. Superficial Great Saphenous Vein: No evidence of thrombus. Normal compressibility. Venous Reflux:  None. Other Findings:  None. IMPRESSION: 1. Positive for extensive acute, occlusive left lower extremity DVT beginning in the distal common femoral vein and extending throughout the leg into the popliteal vein. Electronically Signed   By: Jacqulynn Cadet M.D.   On: 04/28/2020 10:12  Scheduled Meds: . apixaban  10 mg Oral BID   Followed by  . [START ON 05/06/2020] apixaban  5 mg Oral BID  . cholecalciferol  2,000 Units Oral Daily  . diltiazem  180 mg Oral Daily  . ferrous sulfate  325 mg Oral Daily  . FLUoxetine  20 mg Oral Daily  . gabapentin  100 mg Oral QHS  . melatonin  5 mg Oral QHS  . pramipexole  0.25 mg Oral BID  . QUEtiapine  50 mg Oral TID  . vitamin B-12  1,000 mcg Oral Daily   Continuous Infusions:   LOS: 0 days    Time spent: 25 minutes    Sharen Hones, MD [facility]   To contact the attending provider between 7A-7P or the covering provider during after hours 7P-7A, please log into the web site [email or web address] and access using universal [place] password for that web site. If you do not have the password, please call the hospital operator.  04/29/2020, 12:32 PM

## 2020-04-29 NOTE — Progress Notes (Signed)
ANTICOAGULATION CONSULT NOTE - Initial Consult  Pharmacy Consult for Heparin Drip Indication: DVT  No Known Allergies  Patient Measurements: Height: 5\' 4"  (162.6 cm) Weight: 53 kg (116 lb 13.5 oz) IBW/kg (Calculated) : 54.7 Heparin Dosing Weight: 53 kg  Vital Signs: Temp: 99.2 F (37.3 C) (05/09 0517) Temp Source: Oral (05/09 0517) BP: 107/55 (05/09 0517) Pulse Rate: 77 (05/09 0517)  Labs: Recent Labs    04/28/20 0837 04/28/20 1253 04/28/20 1948 04/29/20 0429  HGB 13.2  --   --  11.4*  HCT 40.8  --   --  35.1*  PLT 219  --   --  200  APTT  --  >160*  --   --   HEPARINUNFRC  --  1.40* 0.43 0.39  CREATININE 0.85  --   --   --     Estimated Creatinine Clearance: 36.8 mL/min (by C-G formula based on SCr of 0.85 mg/dL).   Medical History: Past Medical History:  Diagnosis Date  . Cystitis   . GERD (gastroesophageal reflux disease)   . HTN (hypertension)   . RLS (restless legs syndrome)    Assessment: Patient is a 86yo female admitted for DVT. Pharmacy consulted for Heparin dosing. Noted that patient had Apixaban 2.5mg  # 20 tablets filled in 1/21 but none since. Per Med History tech none listed on MAR from nursing facility.  Goal of Therapy:  Heparin level 0.3-0.7 units/ml Monitor platelets by anticoagulation protocol: Yes   Plan:  05/09 @ 0430 HL 0.39 therapeutic. Will continue current rate and will recheck HL w/ am labs, CBC trending down will continue to monitor.  Tobie Lords, PharmD, BCPS Clinical Pharmacist 04/29/2020 5:44 AM

## 2020-04-29 NOTE — Progress Notes (Signed)
The patient has removed 3 IV's today. Voided bladder scan was 57 this AM and MD was notified.

## 2020-04-29 NOTE — Progress Notes (Signed)
ANTICOAGULATION CONSULT NOTE - Initial Consult  Pharmacy Consult for Heparin Drip Indication: DVT  No Known Allergies  Patient Measurements: Height: 5\' 4"  (162.6 cm) Weight: 53 kg (116 lb 13.5 oz) IBW/kg (Calculated) : 54.7 Heparin Dosing Weight: 53 kg  Vital Signs: Temp: 98.5 F (36.9 C) (05/09 1447) Temp Source: Oral (05/09 1447) BP: 108/43 (05/09 1447) Pulse Rate: 70 (05/09 1447)  Labs: Recent Labs    04/28/20 0837 04/28/20 1253 04/28/20 1948 04/29/20 0429  HGB 13.2  --   --  11.4*  HCT 40.8  --   --  35.1*  PLT 219  --   --  200  APTT  --  >160*  --   --   HEPARINUNFRC  --  1.40* 0.43 0.39  CREATININE 0.85  --   --   --     Estimated Creatinine Clearance: 36.8 mL/min (by C-G formula based on SCr of 0.85 mg/dL).   Medical History: Past Medical History:  Diagnosis Date  . Cystitis   . GERD (gastroesophageal reflux disease)   . HTN (hypertension)   . RLS (restless legs syndrome)    Assessment: Patient is a 86yo female admitted for DVT. Pharmacy consulted for Heparin dosing. Noted that patient had Apixaban 2.5mg  # 20 tablets filled in 1/21 but none since. Per Med History tech none listed on MAR from nursing facility.  5/9 AM: Heparin infusion DCd and patient started on apixaban 10mg  BID for 7 days.   5/9 Afternoon- Per MD Vascular Surgery now considering surgery. Apixaban Dcd and patient changed back to heparin infusion.PAtient received apixaban 10mg  5/9 @ ~1115   Goal of Therapy:  Heparin level 0.3-0.7 units/ml Monitor platelets by anticoagulation protocol: Yes   Plan:  Will restart heparin infusion ~ 12 hours after apixaban dose. Heparin ordered to start @ 2330 5/9.  Will start heparin infusion @ rate of 850 units/hr (Patient's HL was therapeutic on this rate prior to DC).  Will need to dose by aPTT levels until aPTT and heparin level correlate. APTT/HL/CBC ordered @ 0730 5/10 AM.  Continue to monitor Hgb and Plts.   5/9:  Dr Roosevelt Locks says pt is no  longer being considered for surgery tomorrow.  He wants to d/c heparin and resume Eliquis.    Orene Desanctis, PharmD Clinical Pharmacist 04/29/2020 7:11 PM

## 2020-04-30 DIAGNOSIS — I10 Essential (primary) hypertension: Secondary | ICD-10-CM | POA: Diagnosis not present

## 2020-04-30 DIAGNOSIS — I82412 Acute embolism and thrombosis of left femoral vein: Secondary | ICD-10-CM | POA: Diagnosis not present

## 2020-04-30 DIAGNOSIS — I82402 Acute embolism and thrombosis of unspecified deep veins of left lower extremity: Secondary | ICD-10-CM | POA: Diagnosis not present

## 2020-04-30 LAB — CBC
HCT: 33.5 % — ABNORMAL LOW (ref 36.0–46.0)
Hemoglobin: 11.1 g/dL — ABNORMAL LOW (ref 12.0–15.0)
MCH: 28.5 pg (ref 26.0–34.0)
MCHC: 33.1 g/dL (ref 30.0–36.0)
MCV: 86.1 fL (ref 80.0–100.0)
Platelets: 213 10*3/uL (ref 150–400)
RBC: 3.89 MIL/uL (ref 3.87–5.11)
RDW: 13.5 % (ref 11.5–15.5)
WBC: 9.5 10*3/uL (ref 4.0–10.5)
nRBC: 0 % (ref 0.0–0.2)

## 2020-04-30 MED ORDER — TRAMADOL HCL 50 MG PO TABS: 50.0000 mg | ORAL_TABLET | Freq: Three times a day (TID) | ORAL | 0 refills | Status: AC | PRN

## 2020-04-30 MED ORDER — TRAMADOL HCL 50 MG PO TABS: 50.0000 mg | ORAL_TABLET | Freq: Three times a day (TID) | ORAL | 0 refills | Status: DC | PRN

## 2020-04-30 MED ORDER — APIXABAN 5 MG PO TABS: 10.0000 mg | ORAL_TABLET | Freq: Two times a day (BID) | ORAL | 0 refills | Status: AC

## 2020-04-30 MED ORDER — APIXABAN 5 MG PO TABS: 5.0000 mg | ORAL_TABLET | Freq: Two times a day (BID) | ORAL | 0 refills | Status: DC

## 2020-04-30 NOTE — TOC Transition Note (Signed)
Transition of Care [facility]) - CM/SW Discharge Note   Patient Details  Name: Paige Vasquez MRN: [id number] Date of Birth: [birth year]  [facility] CM/SW Contact:  Candie Chroman, LCSW Phone Number: 04/30/2020, 12:35 PM   Clinical Narrative: Patient has orders to discharge back to [place] unit today. Nurse will call report to [phone]. EMS transport has been set up. Patient is first on the list. No further concerns. CSW signing off.    Final next level of care: Assisted Living(Memory Care) Barriers to Discharge: Barriers Resolved   Patient Goals and CMS Choice Patient states their goals for this hospitalization and ongoing recovery are:: Patient not fully oriented.   Choice offered to / list presented to : NA  Discharge Placement                Patient to be transferred to facility by: EMS Name of family member notified: Drue Second Patient and family notified of of transfer: 04/30/20  Discharge Plan and Services     Post Acute Care Choice: Resumption of Svcs/PTA Provider                               Social Determinants of Health (SDOH) Interventions     Readmission Risk Interventions No flowsheet data found.

## 2020-04-30 NOTE — Plan of Care (Signed)
The patient has been discharged via EMS to SNF. The patient is left on room air. No falls. Vitals stable. Discharged information provided to [facility]. IV removed.  Problem: Education: Goal: Knowledge of General Education information will improve Description: Including pain rating scale, medication(s)/side effects and non-pharmacologic comfort measures Outcome: Completed/Met   Problem: Health Behavior/Discharge Planning: Goal: Ability to manage health-related needs will improve Outcome: Completed/Met   Problem: Clinical Measurements: Goal: Ability to maintain clinical measurements within normal limits will improve Outcome: Completed/Met Goal: Will remain free from infection Outcome: Completed/Met Goal: Diagnostic test results will improve Outcome: Completed/Met Goal: Respiratory complications will improve Outcome: Completed/Met Goal: Cardiovascular complication will be avoided Outcome: Completed/Met   Problem: Activity: Goal: Risk for activity intolerance will decrease Outcome: Completed/Met   Problem: Nutrition: Goal: Adequate nutrition will be maintained Outcome: Completed/Met   Problem: Coping: Goal: Level of anxiety will decrease Outcome: Completed/Met   Problem: Elimination: Goal: Will not experience complications related to bowel motility Outcome: Completed/Met Goal: Will not experience complications related to urinary retention Outcome: Completed/Met   Problem: Pain Managment: Goal: General experience of comfort will improve Outcome: Completed/Met   Problem: Safety: Goal: Ability to remain free from injury will improve Outcome: Completed/Met   Problem: Skin Integrity: Goal: Risk for impaired skin integrity will decrease Outcome: Completed/Met

## 2020-04-30 NOTE — TOC Initial Note (Signed)
Transition of Care [facility]) - Initial/Assessment Note    Patient Details  Name: Paige Vasquez MRN: [id number] Date of Birth: 01/22/1927  Transition of Care [facility]) CM/SW Contact:    Candie Chroman, LCSW Phone Number: 04/30/2020, 12:20 PM  Clinical Narrative: [place] calling daughter again. No answer. Did not leave a second voicemail. CSW called Lois Goodrick (patient's spouse), introduced role, and explained that discharge planning would be discussed. Patient's husband confirmed she is from [facility]. Patient is a resident on their memory care unit. He asked how her leg was looking today. CSW asked nurse to call him. Memory care nurse has reviewed FL2 and discharge summary and approved return today. Faxed signed FL2 to facility. They asked that Toradol prescription be printed out and signed. MD aware. Will set up transport once this is completed. No further concerns. CSW encouraged patient's husband to contact CSW as needed. CSW will continue to follow patient for support and facilitate return to [place] today.                Expected Discharge Plan: Assisted Living(Memory Care) Barriers to Discharge: Barriers Resolved   Patient Goals and CMS Choice Patient states their goals for this hospitalization and ongoing recovery are:: Patient not fully oriented.   Choice offered to / list presented to : NA  Expected Discharge Plan and Services Expected Discharge Plan: Assisted Living(Memory Care)     Post Acute Care Choice: Resumption of Svcs/PTA Provider Living arrangements for the past 2 months: [place] Expected Discharge Date: 04/30/20                                    Prior Living Arrangements/Services Living arrangements for the past 2 months: [place] Lives with:: Facility Resident Patient language and need for interpreter reviewed:: Yes Do you feel safe going back to the place where you live?: Yes      Need for Family Participation in  Patient Care: Yes (Comment) Care giver support system in place?: Yes (comment)   Criminal Activity/Legal Involvement Pertinent to Current Situation/Hospitalization: No - Comment as needed  Activities of Daily Living   ADL Screening (condition at time of admission) Patient's cognitive ability adequate to safely complete daily activities?: No Is the patient deaf or have difficulty hearing?: Yes Does the patient have difficulty seeing, even when wearing glasses/contacts?: No Does the patient have difficulty concentrating, remembering, or making decisions?: Yes Patient able to express need for assistance with ADLs?: Yes Does the patient have difficulty dressing or bathing?: Yes Independently performs ADLs?: No Communication: Independent Dressing (OT): Needs assistance Is this a change from baseline?: Pre-admission baseline Grooming: Needs assistance Is this a change from baseline?: Pre-admission baseline Feeding: Independent Bathing: Needs assistance Is this a change from baseline?: Pre-admission baseline Toileting: Needs assistance Is this a change from baseline?: Pre-admission baseline In/Out Bed: Needs assistance Is this a change from baseline?: Pre-admission baseline Walks in Home: Needs assistance Is this a change from baseline?: Pre-admission baseline Does the patient have difficulty walking or climbing stairs?: Yes Weakness of Legs: Both Weakness of Arms/Hands: Both  Permission Sought/Granted Permission sought to share information with : Facility Sport and exercise psychologist, Family Supports    Share Information with NAME: Shawnn Rottman  Permission granted to share info w AGENCY: [place] granted to share info w Relationship: Husband  Permission granted to share info w Contact Information: [phone]  Emotional Assessment Appearance:: Appears stated age Attitude/Demeanor/Rapport: Unable to Assess Affect (typically observed): Unable to  Assess Orientation: : Oriented to Self Alcohol / Substance Use: Not Applicable Psych Involvement: No (comment)  Admission diagnosis:  DVT (deep venous thrombosis) (HCC) [I82.409] Acute deep vein thrombosis (DVT) of left lower extremity, unspecified vein ([place]) [I82.402] Patient Active Problem List   Diagnosis Date Noted  . DVT (deep venous thrombosis) ([place]) 04/28/2020  . Leukocytosis 04/28/2020  . Depression with anxiety 04/28/2020  . Diarrhea 04/28/2020  . Bradycardia 01/22/2019  . UTI (urinary tract infection) 07/02/2018  . Falls 07/02/2018  . HTN (hypertension) 07/02/2018  . GERD (gastroesophageal reflux disease) 07/02/2018  . RLS (restless legs syndrome) 07/02/2018   PCP:  Rusty Aus, MD Pharmacy:   [facility], [place] [address] Phone: [phone] Fax: [phone]     Social Determinants of Health (SDOH) Interventions    Readmission Risk Interventions No flowsheet data found.

## 2020-04-30 NOTE — Discharge Summary (Signed)
Physician Discharge Summary  Patient ID: Paige Vasquez MRN: [id number] DOB/AGE: March 01, 1927 86 y.o.  Admit date: 04/28/2020 Discharge date: 04/30/2020  Admission Diagnoses: DVT Discharge Diagnoses:  Principal Problem:   DVT (deep venous thrombosis) (HCC) Active Problems:   HTN (hypertension)   RLS (restless legs syndrome)   Leukocytosis   Depression with anxiety   Diarrhea dementia  Discharged Condition: fair  Hospital Course:  Patient is a 86 year old female with history of hypertension, depression anxiety, dementia and acid reflex who presents to the emergency room with a left leg swelling. Ultrasound showed occlusive left lower extremity DVT. She is evaluated by a vascular surgeon, no need for surgery. She is placed on heparin drip. Anticoagulation was changed to Eliquis after 24 hours of heparin.  At this point, patient is medically stable to be discharged.   Consults: Vascular surgery  Significant Diagnostic Studies:  LEFT LOWER EXTREMITY VENOUS DOPPLER ULTRASOUND  TECHNIQUE: Gray-scale sonography with graded compression, as well as color Doppler and duplex ultrasound were performed to evaluate the lower extremity deep venous systems from the level of the common femoral vein and including the common femoral, femoral, profunda femoral, popliteal and calf veins including the posterior tibial, peroneal and gastrocnemius veins when visible. The superficial great saphenous vein was also interrogated. Spectral Doppler was utilized to evaluate flow at rest and with distal augmentation maneuvers in the common femoral, femoral and popliteal veins.  COMPARISON:  None.  FINDINGS: Contralateral Common Femoral Vein: Respiratory phasicity is normal and symmetric with the symptomatic side. No evidence of thrombus. Normal compressibility.  Common Femoral Vein: Abnormal. The vessel is not compressible in the lumen is expanded and filled with low-level internal echoes.  No evidence of flow on color Doppler imaging. Findings are consistent with acute occlusive DVT.  Saphenofemoral Junction: Occlusive and proximal DVT extends into the saphenofemoral junction great saphenous vein.  Profunda Femoral Vein: Occlusive thrombus extends into the proximal profunda femoral vein.  Femoral Vein: Occlusive thrombus extends throughout the femoral vein in the thigh.  Popliteal Vein: Occlusive thrombus extends into the popliteal vein.  Calf Veins: No evidence of thrombus. Normal compressibility and flow on color Doppler imaging.  Superficial Great Saphenous Vein: No evidence of thrombus. Normal compressibility.  Venous Reflux:  None.  Other Findings:  None.  IMPRESSION: 1. Positive for extensive acute, occlusive left lower extremity DVT beginning in the distal common femoral vein and extending throughout the leg into the popliteal vein.   Electronically Signed   By: Jacqulynn Cadet M.D.   On: 04/28/2020 10:12    Treatments: anticoagulation: heparin  Discharge Exam: Blood pressure (!) 125/58, pulse (!) 55, temperature 99.5 F (37.5 C), resp. rate 18, height 5\' 4"  (1.626 m), weight 53 kg, SpO2 91 %. General appearance: alert and no distress Resp: clear to auscultation bilaterally Cardio: regular rate and rhythm, S1, S2 normal, no murmur, click, rub or gallop GI: soft, non-tender; bowel sounds normal; no masses,  no organomegaly Extremities: Left LE Edema  Oriented to person only  Disposition: Discharge disposition: 03-Skilled Nursing Facility       Discharge Instructions    Diet - low sodium heart healthy   Complete by: As directed    Increase activity slowly   Complete by: As directed      Allergies as of 04/30/2020   No Known Allergies     Medication List    TAKE these medications   acetaminophen 500 MG tablet Commonly known as: TYLENOL Take 1,000 mg by mouth every  6 (six) hours as needed for mild pain or fever.    apixaban 5 MG Tabs tablet Commonly known as: ELIQUIS Take 2 tablets (10 mg total) by mouth 2 (two) times daily for 12 doses.   apixaban 5 MG Tabs tablet Commonly known as: ELIQUIS Take 1 tablet (5 mg total) by mouth 2 (two) times daily. Start taking on: May 05, 2020   Blue-Emu Super Strength Crea Apply 1 application topically every 12 (twelve) hours as needed (knee pain).   cholecalciferol 25 MCG (1000 UNIT) tablet Commonly known as: VITAMIN D Take 2,000 Units by mouth daily.   diltiazem 180 MG 24 hr capsule Commonly known as: CARDIZEM CD Take 180 mg by mouth daily.   ferrous sulfate 325 (65 FE) MG tablet Take 325 mg by mouth daily.   FLUoxetine 20 MG tablet Commonly known as: PROZAC Take 20 mg by mouth daily.   gabapentin 100 MG capsule Commonly known as: NEURONTIN Take 100 mg by mouth at bedtime.   melatonin 3 MG Tabs tablet Take 6 mg by mouth at bedtime.   NONFORMULARY OR COMPOUNDED ITEM Apply 1 application topically in the morning, at noon, and at bedtime. ** LORAZEPAM 1mg /ml topical gel**   NONFORMULARY OR COMPOUNDED ITEM Apply 1 application topically 4 (four) times daily as needed (agitation). ** LORAZEPAM 1mg /ml topical gel**   polyethylene glycol 17 g packet Commonly known as: MIRALAX / GLYCOLAX Take 17 g by mouth daily.   pramipexole 0.25 MG tablet Commonly known as: MIRAPEX Take 0.25 mg by mouth 2 (two) times daily.   QUEtiapine 50 MG tablet Commonly known as: SEROQUEL Take 50 mg by mouth 3 (three) times daily.   traMADol 50 MG tablet Commonly known as: ULTRAM Take 1 tablet (50 mg total) by mouth every 8 (eight) hours as needed for up to 5 days for moderate pain. What changed: Another medication with the same name was removed. Continue taking this medication, and follow the directions you see here.   traZODone 50 MG tablet Commonly known as: DESYREL Take 25 mg by mouth every 12 (twelve) hours as needed (anxiety).   vitamin B-12 1000 MCG  tablet Commonly known as: CYANOCOBALAMIN Take 1,000 mcg by mouth daily.      Follow-up Information    Dew, Erskine Squibb, MD Follow up in 1 week(s).   Specialties: Vascular Surgery, Radiology, Interventional Cardiology Why: Can see Dew or Arna Medici. Will need left lower extremity DVT study with visit.  Contact information: [place] [address] [id number]        Rusty Aus, MD Follow up in 1 week(s).   Specialty: Internal Medicine Contact information: [place] [address] [phone]           Signed: Sharen Hones 04/30/2020, 10:05 AM

## 2020-04-30 NOTE — Clinical Social Work Note (Signed)
Left voicemail for daughter, [name]. Patient lives at [facility] on their memory care unit. Faxed FL2 and discharge summary to nurse, Seth Bake, for review.   Dayton Scrape, [place]

## 2020-04-30 NOTE — NC FL2 (Signed)
[place] LEVEL OF CARE SCREENING TOOL     IDENTIFICATION  Patient Name: Paige Vasquez Birthdate: 12/05/1927 Sex: female Admission Date (Current Location): 04/28/2020  [place] and [place] Number:  Engineering geologist and Address:  [facility], [address], [place],  [address]      Provider Number: [id number]  Attending Physician Name and Address:  Sharen Hones, MD  Relative Name and Phone Number:       Current Level of Care: Hospital Recommended Level of Care: [place], Memory Care Prior Approval Number:    Date Approved/Denied:   PASRR Number:    Discharge Plan: Other (Comment)(ALF Memory Care)    Current Diagnoses: Patient Active Problem List   Diagnosis Date Noted  . DVT (deep venous thrombosis) ([place]) 04/28/2020  . Leukocytosis 04/28/2020  . Depression with anxiety 04/28/2020  . Diarrhea 04/28/2020  . Bradycardia 01/22/2019  . UTI (urinary tract infection) 07/02/2018  . Falls 07/02/2018  . HTN (hypertension) 07/02/2018  . GERD (gastroesophageal reflux disease) 07/02/2018  . RLS (restless legs syndrome) 07/02/2018    Orientation RESPIRATION BLADDER Height & Weight     Self  Normal Continent Weight: 116 lb 13.5 oz (53 kg) Height:  5\' 4"  (162.6 cm)  BEHAVIORAL SYMPTOMS/MOOD NEUROLOGICAL BOWEL NUTRITION STATUS  (None) (None) Incontinent Diet(Low sodium, heart healthy)  AMBULATORY STATUS COMMUNICATION OF NEEDS Skin     Verbally Normal                       Personal Care Assistance Level of Assistance              Functional Limitations Info  Sight, Hearing, Speech Sight Info: Adequate Hearing Info: Adequate Speech Info: Adequate    SPECIAL CARE FACTORS FREQUENCY                       Contractures Contractures Info: Not present    Additional Factors Info  Code Status, Allergies Code Status Info: DNR Allergies Info: NKDA           Current Medications  (04/30/2020):  This is the current hospital active medication list Current Facility-Administered Medications  Medication Dose Route Frequency Provider Last Rate Last Admin  . acetaminophen (TYLENOL) tablet 650 mg  650 mg Oral Q6H PRN Ivor Costa, MD      . apixaban Arne Cleveland) tablet 10 mg  10 mg Oral BID Sharen Hones, MD   10 mg at 04/29/20 2152   Followed by  . [START ON 05/05/2020] apixaban (ELIQUIS) tablet 5 mg  5 mg Oral BID Sharen Hones, MD      . cholecalciferol (VITAMIN D) tablet 2,000 Units  2,000 Units Oral Daily Ivor Costa, MD   2,000 Units at 04/29/20 1100  . diltiazem (CARDIZEM CD) 24 hr capsule 180 mg  180 mg Oral Daily Ivor Costa, MD   180 mg at 04/29/20 1112  . ferrous sulfate tablet 325 mg  325 mg Oral Daily Ivor Costa, MD   325 mg at 04/29/20 1100  . FLUoxetine (PROZAC) capsule 20 mg  20 mg Oral Daily Ivor Costa, MD   20 mg at 04/29/20 1100  . gabapentin (NEURONTIN) capsule 100 mg  100 mg Oral QHS Ivor Costa, MD   100 mg at 04/29/20 2152  . hydrALAZINE (APRESOLINE) injection 5 mg  5 mg Intravenous Q2H PRN Ivor Costa, MD   5 mg at 04/28/20 1513  . melatonin tablet 5  mg  5 mg Oral QHS Ivor Costa, MD   5 mg at 04/29/20 2152  . morphine 2 MG/ML injection 0.5 mg  0.5 mg Intravenous Q4H PRN Ivor Costa, MD   0.5 mg at 04/29/20 0703  . ondansetron (ZOFRAN) injection 4 mg  4 mg Intravenous Q8H PRN Ivor Costa, MD      . oxyCODONE-acetaminophen (PERCOCET/ROXICET) 5-325 MG per tablet 1 tablet  1 tablet Oral Q4H PRN Ivor Costa, MD   1 tablet at 04/28/20 2000  . polyethylene glycol (MIRALAX / GLYCOLAX) packet 17 g  17 g Oral Daily PRN Ivor Costa, MD      . pramipexole (MIRAPEX) tablet 0.25 mg  0.25 mg Oral BID Ivor Costa, MD   0.25 mg at 04/29/20 2152  . QUEtiapine (SEROQUEL) tablet 50 mg  50 mg Oral TID Ivor Costa, MD   50 mg at 04/29/20 2153  . traZODone (DESYREL) tablet 25 mg  25 mg Oral Q12H PRN Ivor Costa, MD   25 mg at 04/28/20 2000  . vitamin B-12 (CYANOCOBALAMIN) tablet 1,000 mcg  1,000  mcg Oral Daily Ivor Costa, MD   1,000 mcg at 04/29/20 1111     Discharge Medications: TAKE these medications   acetaminophen 500 MG tablet Commonly known as: TYLENOL Take 1,000 mg by mouth every 6 (six) hours as needed for mild pain or fever.   apixaban 5 MG Tabs tablet Commonly known as: ELIQUIS Take 2 tablets (10 mg total) by mouth 2 (two) times daily for 12 doses.   apixaban 5 MG Tabs tablet Commonly known as: ELIQUIS Take 1 tablet (5 mg total) by mouth 2 (two) times daily. Start taking on: May 05, 2020   Blue-Emu Super Strength Crea Apply 1 application topically every 12 (twelve) hours as needed (knee pain).   cholecalciferol 25 MCG (1000 UNIT) tablet Commonly known as: VITAMIN D Take 2,000 Units by mouth daily.   diltiazem 180 MG 24 hr capsule Commonly known as: CARDIZEM CD Take 180 mg by mouth daily.   ferrous sulfate 325 (65 FE) MG tablet Take 325 mg by mouth daily.   FLUoxetine 20 MG tablet Commonly known as: PROZAC Take 20 mg by mouth daily.   gabapentin 100 MG capsule Commonly known as: NEURONTIN Take 100 mg by mouth at bedtime.   melatonin 3 MG Tabs tablet Take 6 mg by mouth at bedtime.   NONFORMULARY OR COMPOUNDED ITEM Apply 1 application topically in the morning, at noon, and at bedtime. ** LORAZEPAM 1mg /ml topical gel**   NONFORMULARY OR COMPOUNDED ITEM Apply 1 application topically 4 (four) times daily as needed (agitation). ** LORAZEPAM 1mg /ml topical gel**   polyethylene glycol 17 g packet Commonly known as: MIRALAX / GLYCOLAX Take 17 g by mouth daily.   pramipexole 0.25 MG tablet Commonly known as: MIRAPEX Take 0.25 mg by mouth 2 (two) times daily.   QUEtiapine 50 MG tablet Commonly known as: SEROQUEL Take 50 mg by mouth 3 (three) times daily.   traMADol 50 MG tablet Commonly known as: ULTRAM Take 1 tablet (50 mg total) by mouth every 8 (eight) hours as needed for up to 5 days for moderate pain. What changed: Another  medication with the same name was removed. Continue taking this medication, and follow the directions you see here.   traZODone 50 MG tablet Commonly known as: DESYREL Take 25 mg by mouth every 12 (twelve) hours as needed (anxiety).   vitamin B-12 1000 MCG tablet Commonly known as: CYANOCOBALAMIN Take 1,000 mcg  by mouth daily.     Relevant Imaging Results:  Relevant Lab Results:   Additional Information SS#: [id number]  Candie Chroman, LCSW

## 2020-04-30 NOTE — Care Management Obs Status (Signed)
[place] NOTIFICATION   Patient Details  Name: Paige Vasquez MRN: [id number] Date of Birth: 1927/02/27   Medicare Observation Status Notification Given:  Yes    Candie Chroman, LCSW 04/30/2020, 12:14 PM

## 2020-05-17 ENCOUNTER — Encounter (INDEPENDENT_AMBULATORY_CARE_PROVIDER_SITE_OTHER): Payer: Self-pay | Admitting: Nurse Practitioner

## 2020-05-17 ENCOUNTER — Other Ambulatory Visit (INDEPENDENT_AMBULATORY_CARE_PROVIDER_SITE_OTHER): Payer: Self-pay | Admitting: Vascular Surgery

## 2020-05-17 ENCOUNTER — Other Ambulatory Visit (INDEPENDENT_AMBULATORY_CARE_PROVIDER_SITE_OTHER): Payer: Self-pay | Admitting: Nurse Practitioner

## 2020-05-17 ENCOUNTER — Other Ambulatory Visit: Payer: Self-pay

## 2020-05-17 ENCOUNTER — Ambulatory Visit (INDEPENDENT_AMBULATORY_CARE_PROVIDER_SITE_OTHER): Payer: Medicare Other

## 2020-05-17 ENCOUNTER — Ambulatory Visit (INDEPENDENT_AMBULATORY_CARE_PROVIDER_SITE_OTHER): Payer: Medicare Other | Admitting: Nurse Practitioner

## 2020-05-17 VITALS — BP 152/77 | HR 59

## 2020-05-17 DIAGNOSIS — I10 Essential (primary) hypertension: Secondary | ICD-10-CM | POA: Diagnosis not present

## 2020-05-17 DIAGNOSIS — G309 Alzheimer's disease, unspecified: Secondary | ICD-10-CM

## 2020-05-17 DIAGNOSIS — I82412 Acute embolism and thrombosis of left femoral vein: Secondary | ICD-10-CM | POA: Diagnosis not present

## 2020-05-17 DIAGNOSIS — I739 Peripheral vascular disease, unspecified: Secondary | ICD-10-CM

## 2020-05-17 DIAGNOSIS — F0281 Dementia in other diseases classified elsewhere with behavioral disturbance: Secondary | ICD-10-CM | POA: Diagnosis not present

## 2020-05-17 DIAGNOSIS — I82402 Acute embolism and thrombosis of unspecified deep veins of left lower extremity: Secondary | ICD-10-CM | POA: Diagnosis not present

## 2020-05-18 ENCOUNTER — Encounter (INDEPENDENT_AMBULATORY_CARE_PROVIDER_SITE_OTHER): Payer: Self-pay | Admitting: Nurse Practitioner

## 2020-05-18 NOTE — Progress Notes (Signed)
Subjective:    Patient ID: Paige Vasquez, female    DOB: 1927-07-15, 86 y.o.   MRN: [id number] Chief Complaint  Patient presents with  . Follow-up    1 week [facility] post acute LLE ven DVT    The patient presents today after having a left lower extremity DVT initially found on 04/28/2020 at [facility].  The patient presented without family present and due to her history of dementia and she was a very poor historian.  Based on the patient's age in addition to her dementia it was felt that conservative treatment would be best, because the patient was not in any acute distress.  The patient was started on Eliquis in the emergency room and has continued since.  The nursing facility attendant with the patient denies any chest pain or shortness of breath complaints.  The patient states that her left lower extremity is sore and painful due to the swelling.  The nursing facility attendant states that they have tried to utilize medical grade compression however it is been difficult for her to wear.  They have also try to elevate her lower extremities as possible.  They also report that there has been no issue with taking her Eliquis or issues with bleeding following starting Eliquis.  They deny any frank bloody stools or black stools.  They deny any nausea vomiting or diarrhea.  Today noninvasive studies show there is recannulized thrombus in the left common femoral vein.  The patient has a chronic deep venous thrombosis extending from the chronic femoral vein to the distal femoral vein including the saphenofemoral junction.  These findings are improved from her previous examination.  The right lower extremity has no evidence of DVT.   Review of Systems  Cardiovascular: Positive for leg swelling.  Musculoskeletal: Positive for myalgias.  Psychiatric/Behavioral: Positive for confusion.  All other systems reviewed and are negative.      Objective:   Physical Exam Vitals reviewed. Exam  conducted with a chaperone present (Nursing facility employee present).  HENT:     Head: Normocephalic.  Cardiovascular:     Rate and Rhythm: Normal rate and regular rhythm.     Pulses: Normal pulses.     Heart sounds: Normal heart sounds.  Musculoskeletal:        General: Tenderness present.  Neurological:     Mental Status: She is alert.  Psychiatric:        Attention and Perception: She is inattentive.        Speech: Speech is delayed.        Cognition and Memory: Cognition is impaired. Memory is impaired. She exhibits impaired recent memory.     BP (!) 152/77   Pulse (!) 59   Past Medical History:  Diagnosis Date  . Cystitis   . GERD (gastroesophageal reflux disease)   . HTN (hypertension)   . RLS (restless legs syndrome)     Social History   Socioeconomic History  . Marital status: Married    Spouse name: Not on file  . Number of children: Not on file  . Years of education: Not on file  . Highest education level: Not on file  Occupational History  . Not on file  Tobacco Use  . Smoking status: Never Smoker  . Smokeless tobacco: Never Used  Substance and Sexual Activity  . Alcohol use: Not on file  . Drug use: Not on file  . Sexual activity: Not on file  Other Topics Concern  .  Not on file  Social History Narrative  . Not on file   Social Determinants of Health   Financial Resource Strain:   . Difficulty of Paying Living Expenses:   Food Insecurity:   . Worried About Charity fundraiser in the Last Year:   . Arboriculturist in the Last Year:   Transportation Needs:   . Film/video editor (Medical):   Marland Kitchen Lack of Transportation (Non-Medical):   Physical Activity:   . Days of Exercise per Week:   . Minutes of Exercise per Session:   Stress:   . Feeling of Stress :   Social Connections:   . Frequency of Communication with Friends and Family:   . Frequency of Social Gatherings with Friends and Family:   . Attends Religious Services:   . Active  Member of Clubs or Organizations:   . Attends Archivist Meetings:   Marland Kitchen Marital Status:   Intimate Partner Violence:   . Fear of Current or Ex-Partner:   . Emotionally Abused:   Marland Kitchen Physically Abused:   . Sexually Abused:     Past Surgical History:  Procedure Laterality Date  . ABDOMINAL HYSTERECTOMY    . NASAL SINUS SURGERY      Family History  Problem Relation Age of Onset  . Colon cancer Mother   . Emphysema Father     No Known Allergies     Assessment & Plan:   1. Acute deep vein thrombosis (DVT) of left lower extremity, unspecified vein (HCC) Currently the patient's biggest issue is that her left lower extremity has extensive swelling.  Due to her dementia as well as the length of time since her DVT was initially found, a left lower extremity thrombectomy would not be a viable option.  The patient will continue with Eliquis as she is not having any difficulty with taking her medications and there has been no evidence of bleeding at this time.  In order to try to help with patient's lower extremity edema we will place Unna wraps on the patient today.  This may be somewhat difficult due to the pain the patient has with wearing compression socks.  Will place Unna wraps today and they will be changed weekly at her nursing facility.  However the nursing facility is instructed that if she is unable to tolerate them they can be cut off and try to utilize compression socks instead.  Otherwise they should continue to elevate her lower extremity.  2. Essential hypertension Continue antihypertensive medications as already ordered, these medications have been reviewed and there are no changes at this time.   3. Alzheimer's dementia with behavioral disturbance, unspecified timing of dementia onset ([place]) Patient's dementia will make compliance with conservative therapy somewhat difficult however the patient is in a nursing facility that can help her with continue with conservative  therapy.   Current Outpatient Medications on File Prior to Visit  Medication Sig Dispense Refill  . acetaminophen (TYLENOL) 500 MG tablet Take 1,000 mg by mouth every 6 (six) hours as needed for mild pain or fever.    Marland Kitchen apixaban (ELIQUIS) 5 MG TABS tablet Take 2 tablets (10 mg total) by mouth 2 (two) times daily for 12 doses. 24 tablet 0  . apixaban (ELIQUIS) 5 MG TABS tablet Take 1 tablet (5 mg total) by mouth 2 (two) times daily. 60 tablet 0  . cholecalciferol (VITAMIN D) 25 MCG (1000 UT) tablet Take 2,000 Units by mouth daily.     Marland Kitchen  diltiazem (CARDIZEM CD) 180 MG 24 hr capsule Take 180 mg by mouth daily.    . ferrous sulfate 325 (65 FE) MG tablet Take 325 mg by mouth daily.    Marland Kitchen FLUoxetine (PROZAC) 20 MG tablet Take 20 mg by mouth daily.     Marland Kitchen gabapentin (NEURONTIN) 100 MG capsule Take 100 mg by mouth at bedtime.    . Liniments (BLUE-EMU SUPER STRENGTH) CREA Apply 1 application topically every 12 (twelve) hours as needed (knee pain).    . melatonin 3 MG TABS tablet Take 6 mg by mouth at bedtime.    . NONFORMULARY OR COMPOUNDED ITEM Apply 1 application topically in the morning, at noon, and at bedtime. ** LORAZEPAM 1mg /ml topical gel**    . NONFORMULARY OR COMPOUNDED ITEM Apply 1 application topically 4 (four) times daily as needed (agitation). ** LORAZEPAM 1mg /ml topical gel**    . polyethylene glycol (MIRALAX / GLYCOLAX) 17 g packet Take 17 g by mouth daily.    . pramipexole (MIRAPEX) 0.25 MG tablet Take 0.25 mg by mouth 2 (two) times daily.    . QUEtiapine (SEROQUEL) 50 MG tablet Take 50 mg by mouth 3 (three) times daily.    . traZODone (DESYREL) 50 MG tablet Take 25 mg by mouth every 12 (twelve) hours as needed (anxiety).    . vitamin B-12 (CYANOCOBALAMIN) 1000 MCG tablet Take 1,000 mcg by mouth daily.     No current facility-administered medications on file prior to visit.    There are no Patient Instructions on file for this visit. No follow-ups on file.   Kris Hartmann,  NP

## 2020-05-24 ENCOUNTER — Telehealth (INDEPENDENT_AMBULATORY_CARE_PROVIDER_SITE_OTHER): Payer: Self-pay | Admitting: Vascular Surgery

## 2020-05-24 IMAGING — CT CT HEAD W/O CM
3 series · 16 of 47 positions shown, 19 images · non-contrast
Comparison: 02/28/2020

CLINICAL DATA: Recent fall, initial encounter

EXAM:
CT HEAD WITHOUT CONTRAST
TECHNIQUE: Contiguous axial images were obtained from the base of the skull
through the vertex without intravenous contrast.

[Series 2: head wo · axial · 0.39mm/px · z∈[-118,+7]mm · 10 of 31 slices shown, 13 images]
[im 3/31  brain]
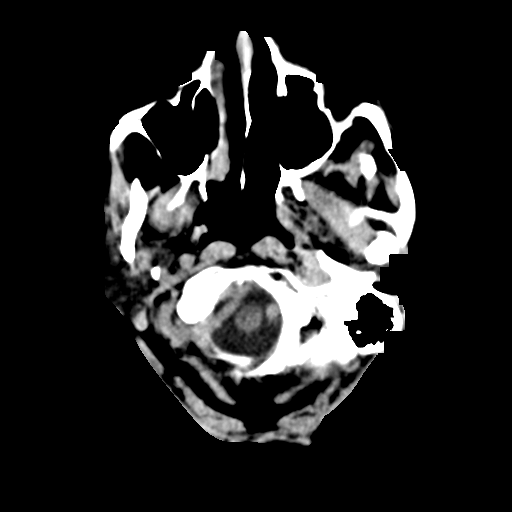
[im 3/31  bone]
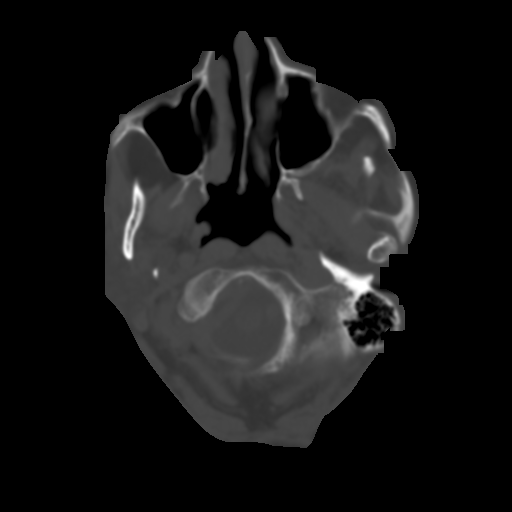
[im 6/31  brain]
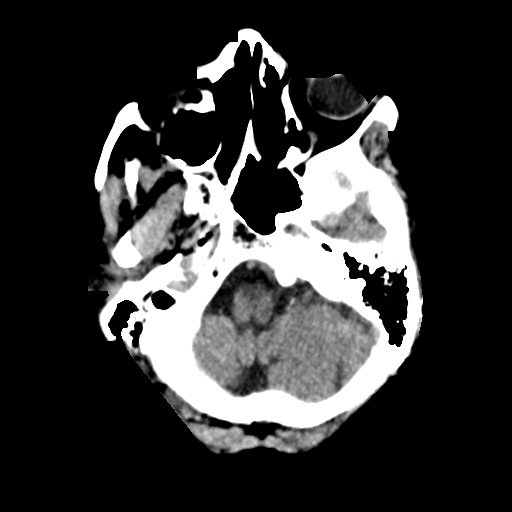
[im 9/31  brain]
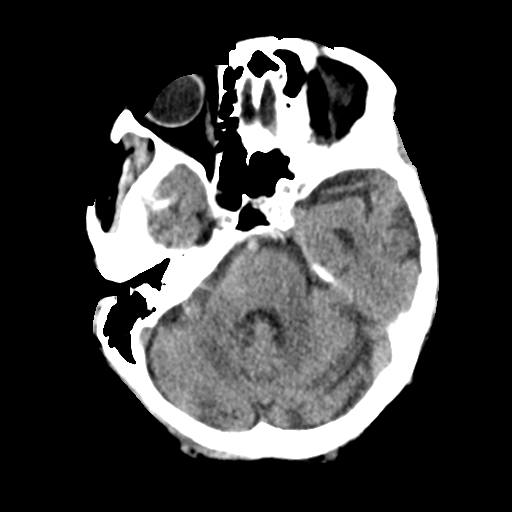
[im 11/31  brain]
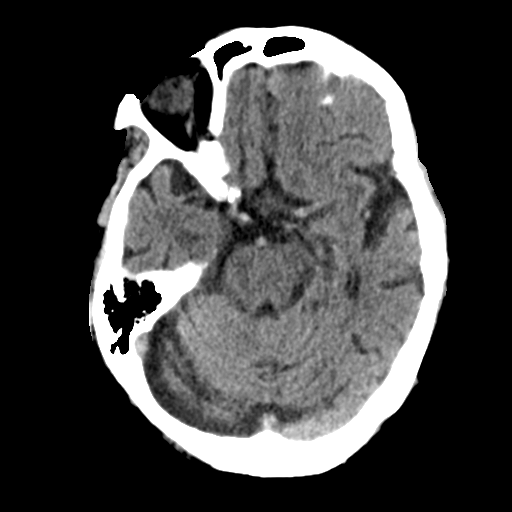
[im 14/31  brain]
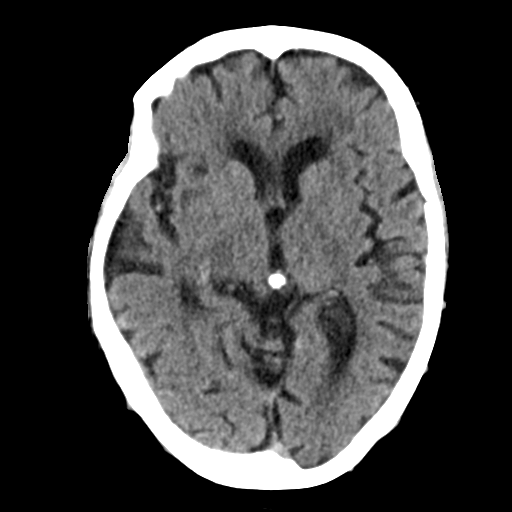
[im 14/31  bone]
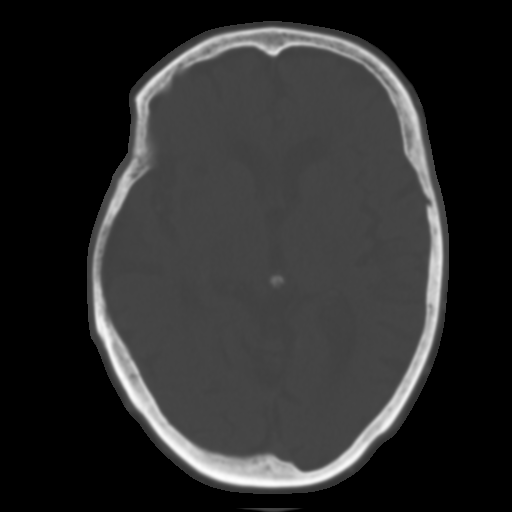
[im 17/31  brain]
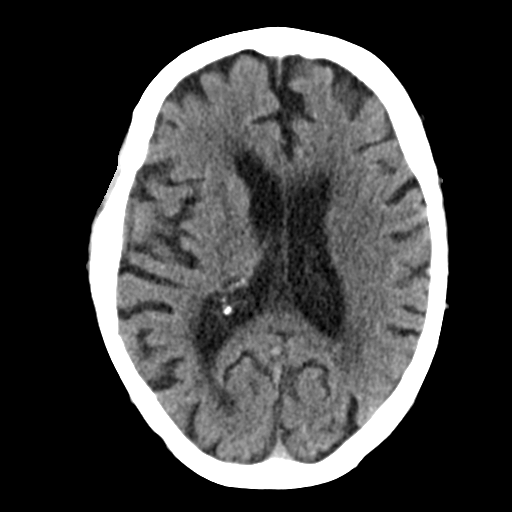
[im 20/31  brain]
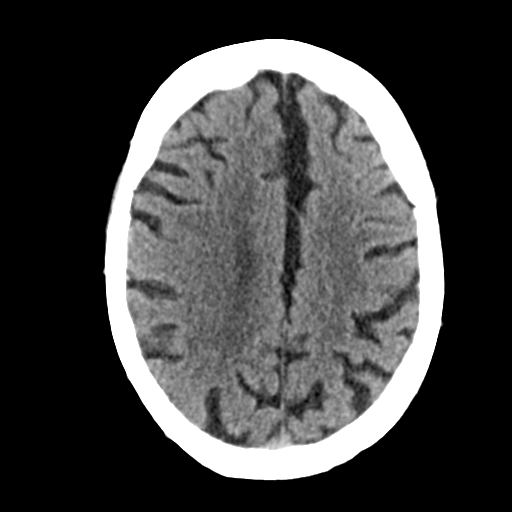
[im 23/31  brain]
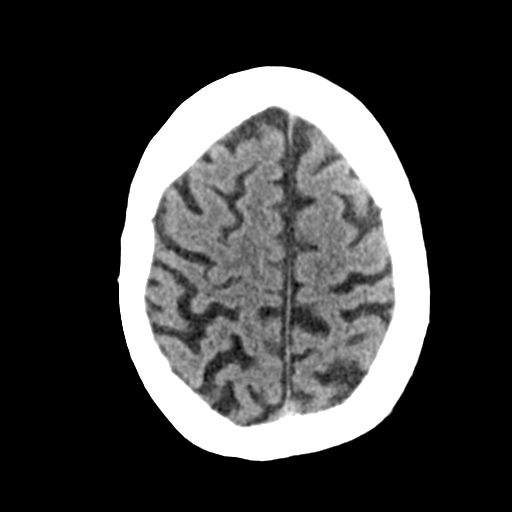
[im 25/31  brain]
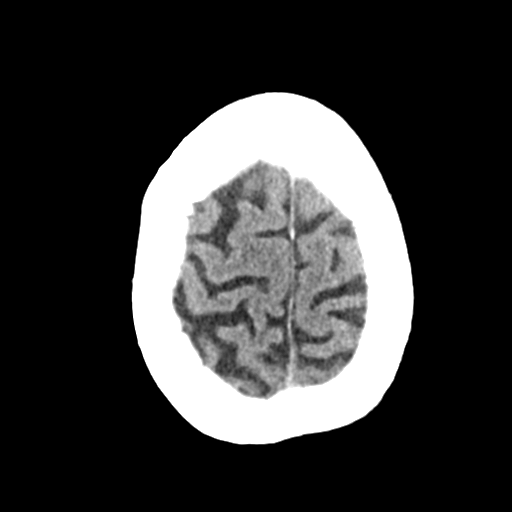
[im 25/31  bone]
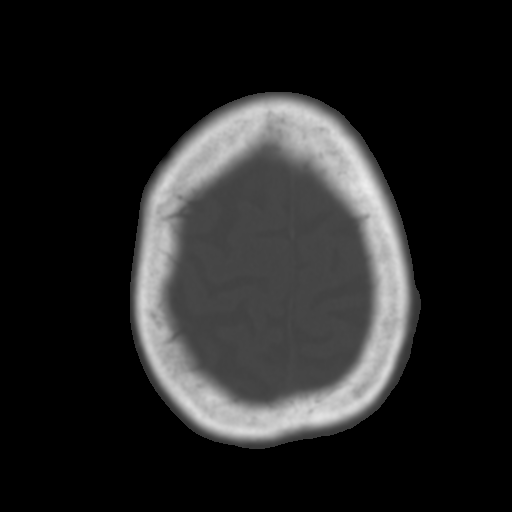
[im 28/31  brain]
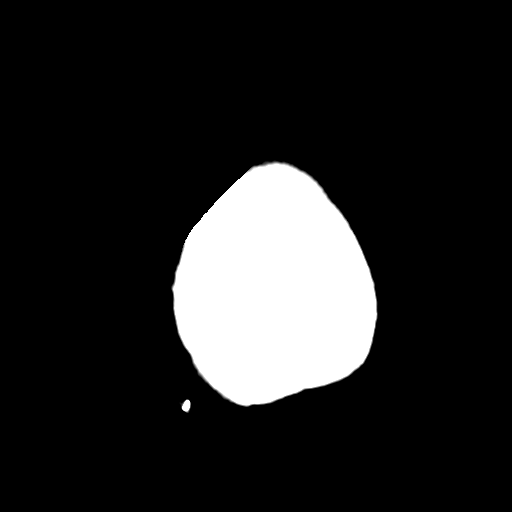

[Series 4: coronal soft tissue · coronal · 0.30mm/px · 3 of 67 slices shown]
[im 23/67  brain]
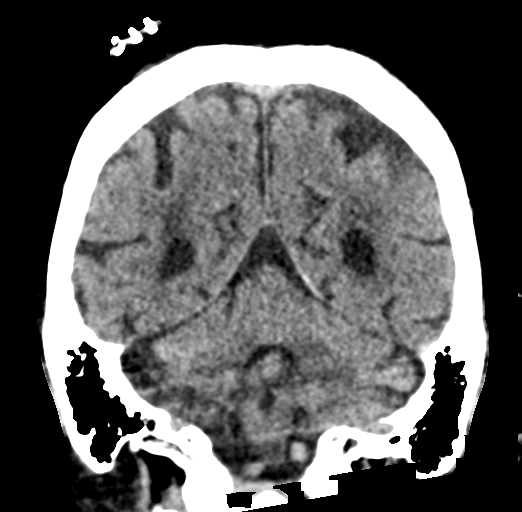
[im 30/67  brain]
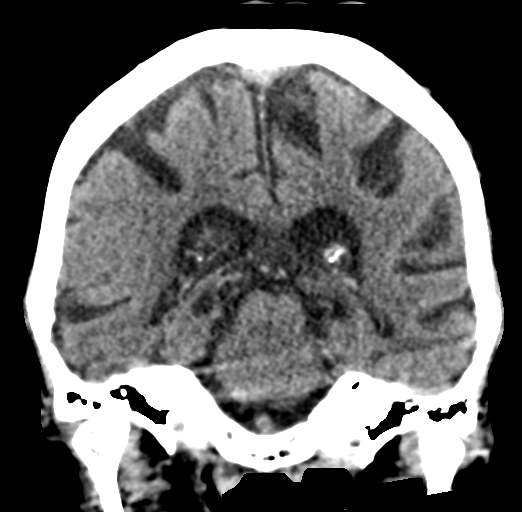
[im 37/67  brain]
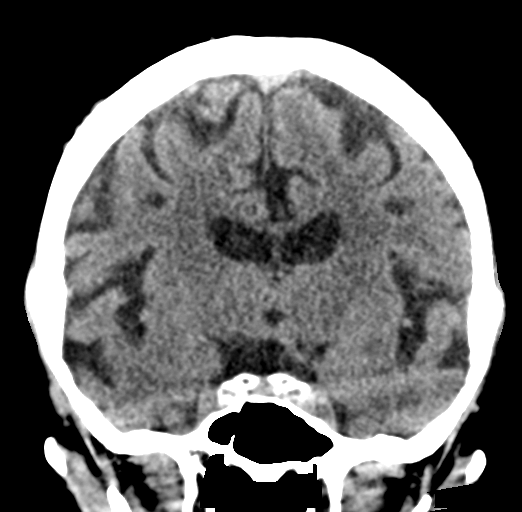

[Series 5: sagittal soft tissue · sagittal · 0.30mm/px · 3 of 52 slices shown]
[im 20/52  brain]
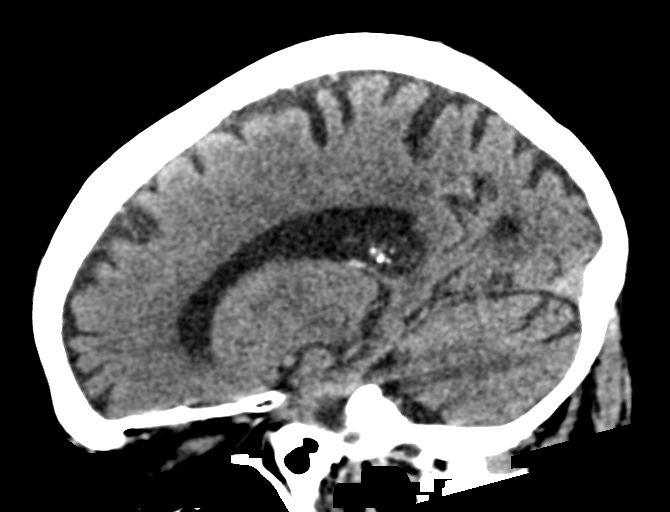
[im 26/52  brain]
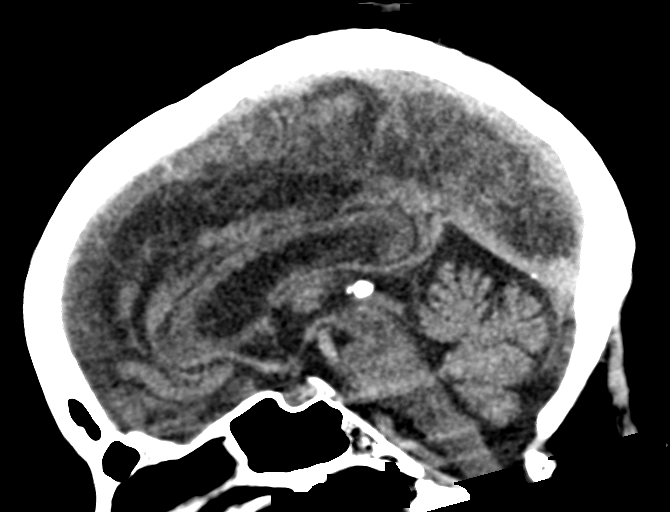
[im 32/52  brain]
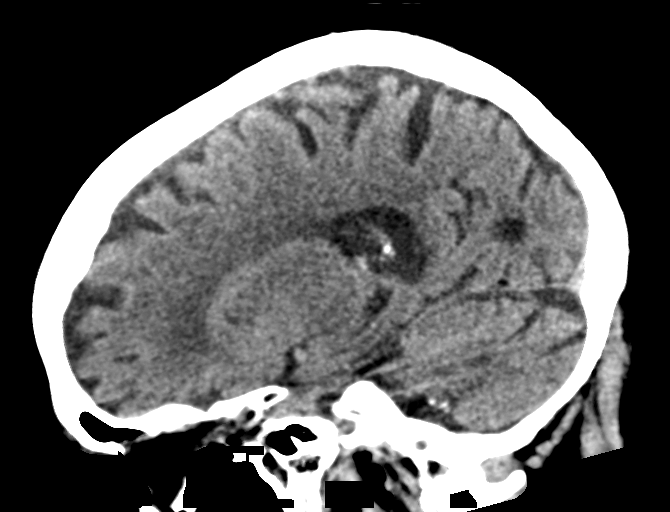

[16 of 47 positions shown; findings below may reference images not displayed]

FINDINGS: Brain: No evidence of acute infarction, hemorrhage, hydrocephalus,
extra-axial collection or mass lesion/mass effect. Chronic atrophic
and ischemic changes are noted.

Vascular: No hyperdense vessel or unexpected calcification.

Skull: Normal. Negative for fracture or focal lesion.

Sinuses/Orbits: No acute finding.  Postsurgical changes are noted.

Other: None.
IMPRESSION: Chronic atrophic and ischemic changes without acute abnormality.

## 2020-05-24 IMAGING — DX DG KNEE COMPLETE 4+V*R*
4 series · 4 of 4 positions shown · non-contrast
Comparison: Knee radiographs 02/28/2020

CLINICAL DATA: Fall, knee pain

EXAM:
RIGHT KNEE - COMPLETE 4+ VIEW

[knee ap]
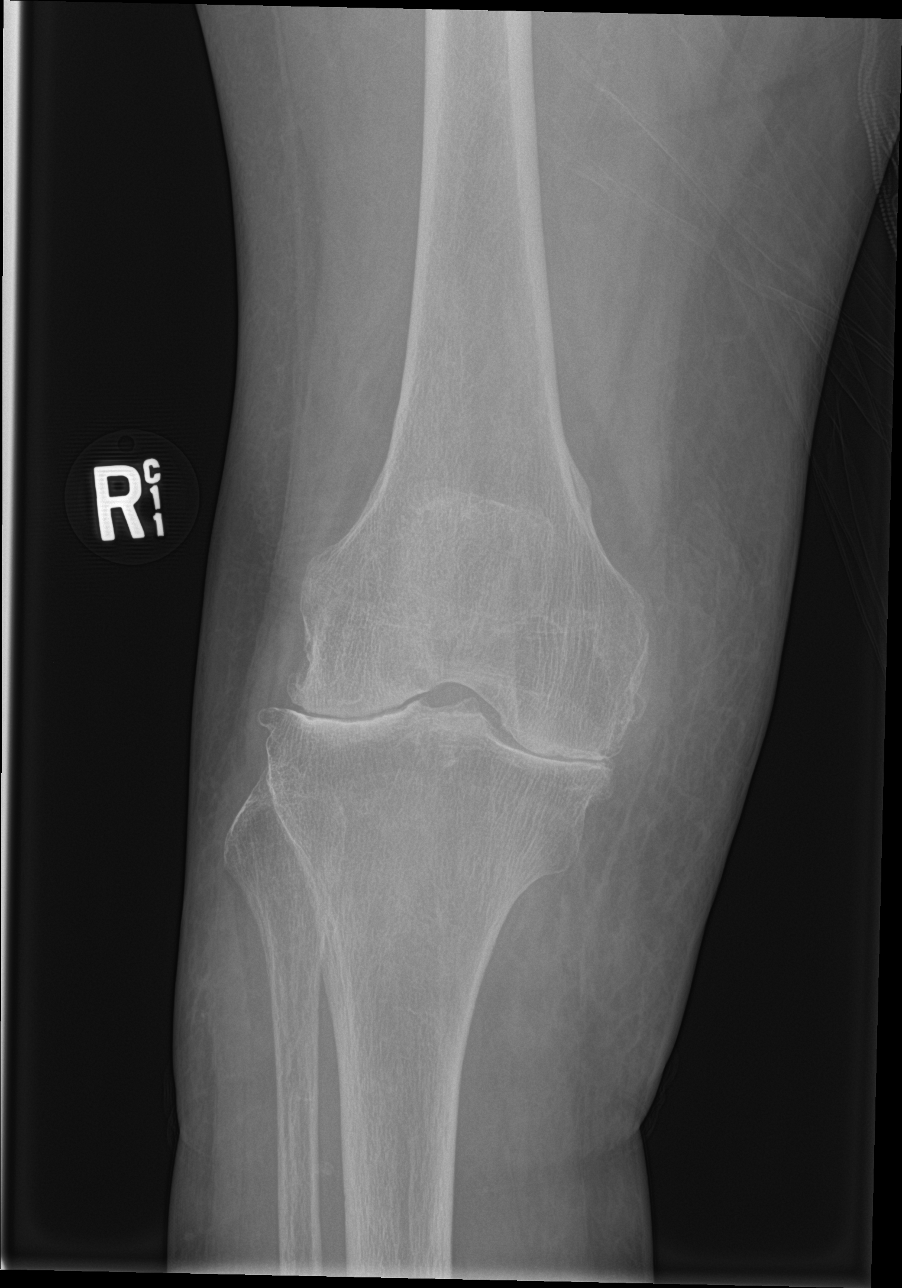

[knee lat]
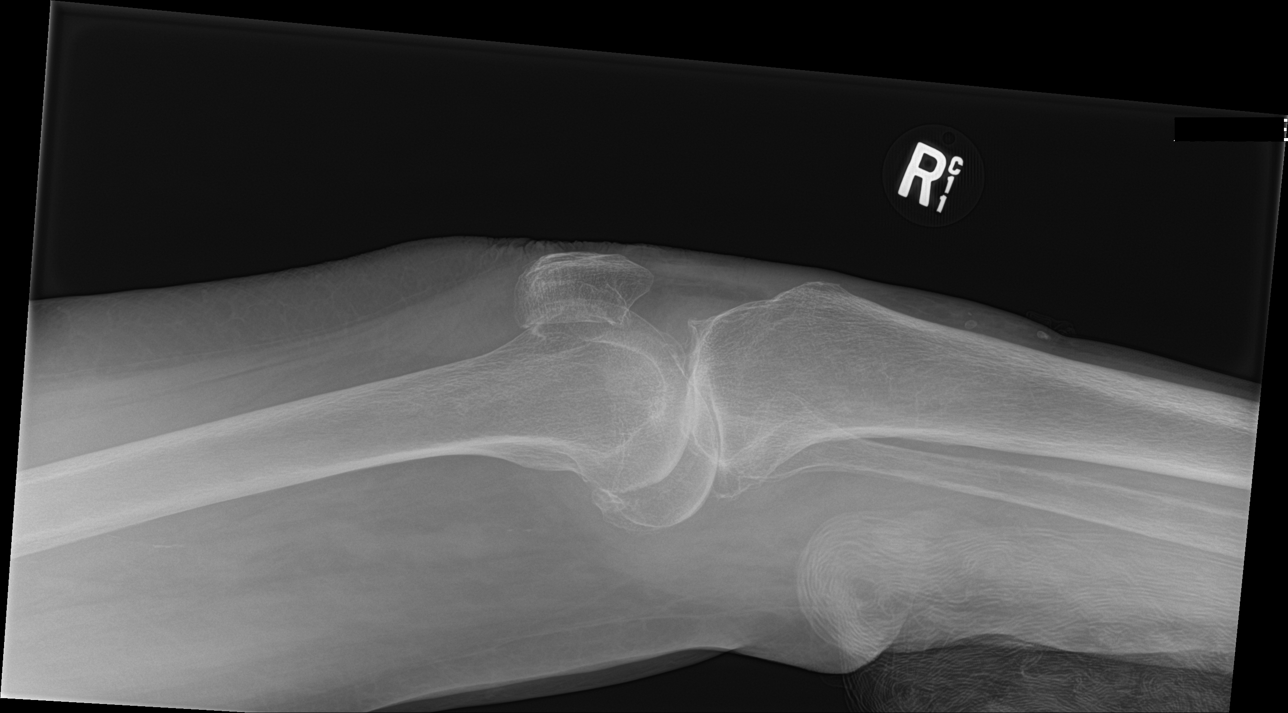

[knee obl (1 of 2)]
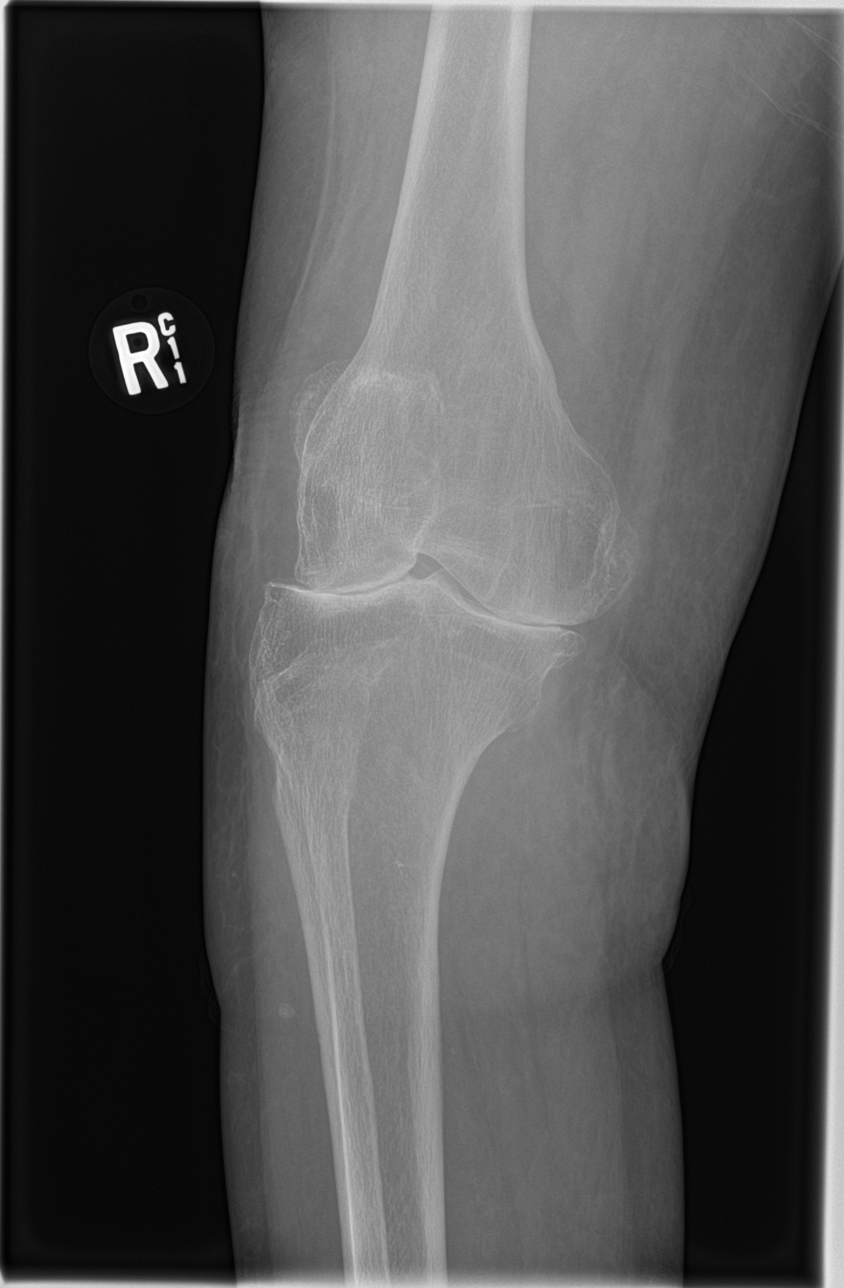

[knee obl (2 of 2)]
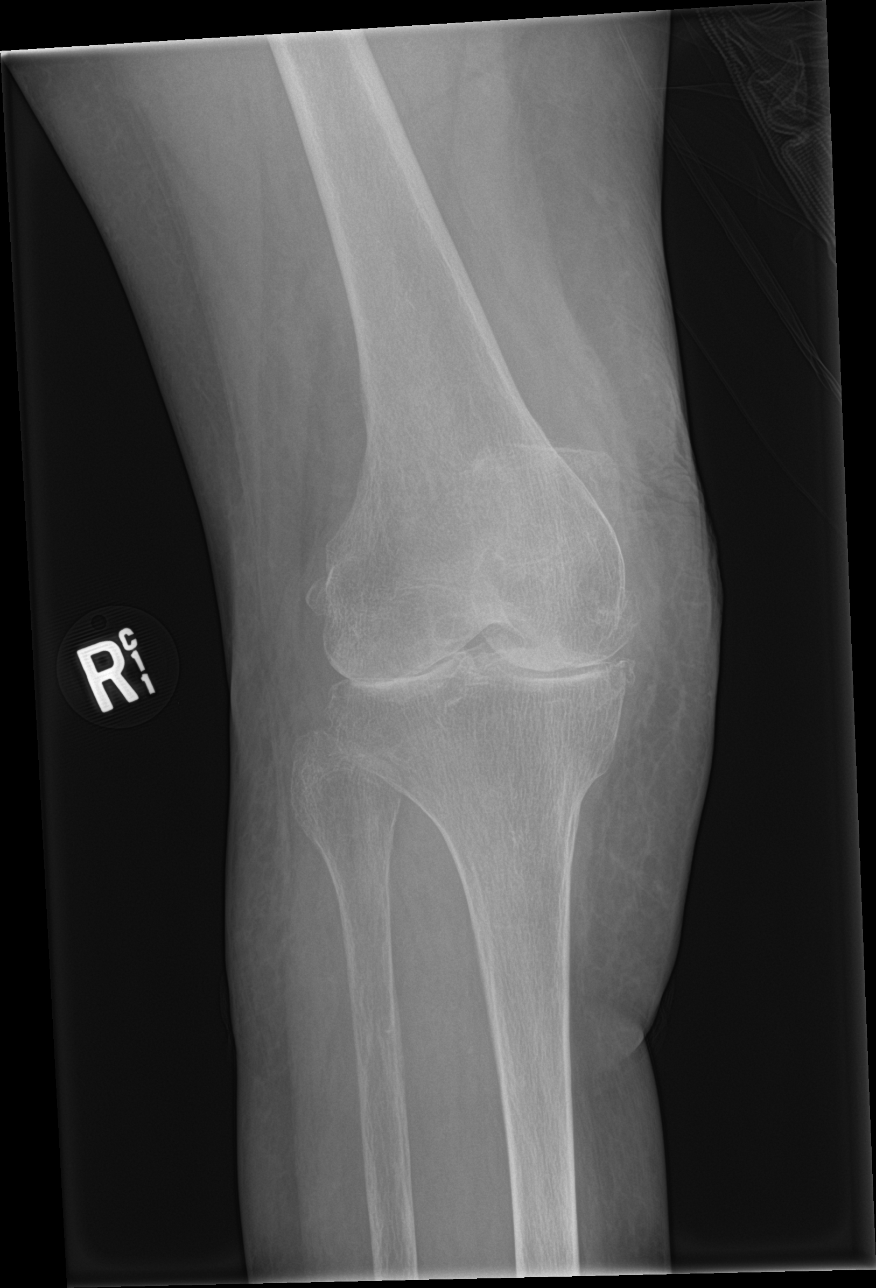

[4 of 4 positions shown; findings below may reference images not displayed]

FINDINGS: No acute fracture or traumatic malalignment. Small suprapatellar
joint effusion with mild knee swelling. There is moderate to severe
tricompartmental osteoarthrosis with near bone-on-bone contact at
the medial and lateral femorotibial compartments, incompletely
assessed on nonweightbearing radiograph. No suspicious or worrisome
osseous lesions.
IMPRESSION: 1. No acute fracture or traumatic malalignment.
2. Small suprapatellar joint effusion and mild knee swelling.
3. Moderate to severe tricompartmental osteoarthrosis.

## 2020-05-31 ENCOUNTER — Ambulatory Visit (INDEPENDENT_AMBULATORY_CARE_PROVIDER_SITE_OTHER): Payer: Medicare Other | Admitting: Nurse Practitioner

## 2020-05-31 ENCOUNTER — Other Ambulatory Visit: Payer: Self-pay

## 2020-05-31 ENCOUNTER — Encounter (INDEPENDENT_AMBULATORY_CARE_PROVIDER_SITE_OTHER): Payer: Self-pay

## 2020-05-31 VITALS — BP 93/57 | HR 60 | Resp 14

## 2020-05-31 DIAGNOSIS — I82402 Acute embolism and thrombosis of unspecified deep veins of left lower extremity: Secondary | ICD-10-CM | POA: Diagnosis not present

## 2020-05-31 NOTE — Progress Notes (Signed)
History of Present Illness  There is no documented history at this time  Assessments & Plan   There are no diagnoses linked to this encounter.    Additional instructions  Subjective:  Patient presents with venous ulcer of the Left lower extremity.    Procedure:  3 layer unna wrap was placed Left lower extremity.   Plan:   Follow up in one week.  

## 2020-06-07 ENCOUNTER — Encounter (INDEPENDENT_AMBULATORY_CARE_PROVIDER_SITE_OTHER): Payer: Medicare Other

## 2020-06-14 ENCOUNTER — Encounter (INDEPENDENT_AMBULATORY_CARE_PROVIDER_SITE_OTHER): Payer: Medicare Other

## 2020-06-21 ENCOUNTER — Encounter (INDEPENDENT_AMBULATORY_CARE_PROVIDER_SITE_OTHER): Payer: Medicare Other

## 2020-06-28 ENCOUNTER — Encounter (INDEPENDENT_AMBULATORY_CARE_PROVIDER_SITE_OTHER): Payer: Self-pay | Admitting: Nurse Practitioner

## 2020-06-28 ENCOUNTER — Ambulatory Visit (INDEPENDENT_AMBULATORY_CARE_PROVIDER_SITE_OTHER): Payer: Medicare Other | Admitting: Nurse Practitioner

## 2020-06-28 ENCOUNTER — Other Ambulatory Visit: Payer: Self-pay

## 2020-06-28 VITALS — BP 159/71 | HR 47 | Resp 14

## 2020-06-28 DIAGNOSIS — I82402 Acute embolism and thrombosis of unspecified deep veins of left lower extremity: Secondary | ICD-10-CM

## 2020-06-28 DIAGNOSIS — F0281 Dementia in other diseases classified elsewhere with behavioral disturbance: Secondary | ICD-10-CM

## 2020-06-28 DIAGNOSIS — G309 Alzheimer's disease, unspecified: Secondary | ICD-10-CM | POA: Diagnosis not present

## 2020-06-28 DIAGNOSIS — I10 Essential (primary) hypertension: Secondary | ICD-10-CM | POA: Diagnosis not present

## 2020-07-02 ENCOUNTER — Encounter (INDEPENDENT_AMBULATORY_CARE_PROVIDER_SITE_OTHER): Payer: Self-pay | Admitting: Nurse Practitioner

## 2020-07-02 NOTE — Progress Notes (Signed)
Subjective:    Patient ID: Paige Vasquez, female    DOB: [birth year], 86 y.o.   MRN: [id number] Chief Complaint  Patient presents with  . Follow-up    unna check    The patient presents today after having a left lower extremity DVT initially found on 04/28/2020 at [facility].  Initially the patient had severe pain and swelling in this left lower extremity.  Today however she is not in any distress and the swelling is greatly decreased.  The patient has Alzheimer's dementia and is a poor historian however the patient does have a family member present.  They state that they have been utilizing medical grade 1 compression stockings for the patient she has been doing well with them.  She is also been walking regularly.  She denies any issues with her taking her Eliquis to her issues.  Note sent by nursing facility indicate that the patient has been compliant with her medications.  The family denies any chest pain, shortness of breath, or episodes of bleeding.      Review of Systems  Cardiovascular: Positive for leg swelling.  Psychiatric/Behavioral: Positive for confusion.  All other systems reviewed and are negative.      Objective:   Physical Exam Vitals reviewed.  HENT:     Head: Normocephalic.  Cardiovascular:     Rate and Rhythm: Normal rate and regular rhythm.     Pulses: Normal pulses.     Heart sounds: Normal heart sounds.  Pulmonary:     Effort: Pulmonary effort is normal.     Breath sounds: Normal breath sounds.  Musculoskeletal:     Right lower leg: 1+ Edema present.     Left lower leg: 1+ Edema present.  Neurological:     Mental Status: She is alert. Mental status is at baseline. She is disoriented.  Psychiatric:        Mood and Affect: Mood normal.        Cognition and Memory: Cognition is impaired. Memory is impaired.     BP (!) 159/71 (BP Location: Right Arm)   Pulse (!) 47   Resp 14   Past Medical History:  Diagnosis Date  . Cystitis    . GERD (gastroesophageal reflux disease)   . HTN (hypertension)   . RLS (restless legs syndrome)     Social History   Socioeconomic History  . Marital status: Married    Spouse name: Not on file  . Number of children: Not on file  . Years of education: Not on file  . Highest education level: Not on file  Occupational History  . Not on file  Tobacco Use  . Smoking status: Never Smoker  . Smokeless tobacco: Never Used  Substance and Sexual Activity  . Alcohol use: Not on file  . Drug use: Not on file  . Sexual activity: Not on file  Other Topics Concern  . Not on file  Social History Narrative  . Not on file   Social Determinants of Health   Financial Resource Strain:   . Difficulty of Paying Living Expenses:   Food Insecurity:   . Worried About Charity fundraiser in the Last Year:   . Arboriculturist in the Last Year:   Transportation Needs:   . Film/video editor (Medical):   Marland Kitchen Lack of Transportation (Non-Medical):   Physical Activity:   . Days of Exercise per Week:   . Minutes of Exercise per Session:  Stress:   . Feeling of Stress :   Social Connections:   . Frequency of Communication with Friends and Family:   . Frequency of Social Gatherings with Friends and Family:   . Attends Religious Services:   . Active Member of Clubs or Organizations:   . Attends Archivist Meetings:   Marland Kitchen Marital Status:   Intimate Partner Violence:   . Fear of Current or Ex-Partner:   . Emotionally Abused:   Marland Kitchen Physically Abused:   . Sexually Abused:     Past Surgical History:  Procedure Laterality Date  . ABDOMINAL HYSTERECTOMY    . NASAL SINUS SURGERY      Family History  Problem Relation Age of Onset  . Colon cancer Mother   . Emphysema Father     No Known Allergies     Assessment & Plan:   1. Acute deep vein thrombosis (DVT) of left lower extremity, unspecified vein (HCC) The patient's lower extremity edema experience with her DVT is much  improved.  The patient has actually transitioned out of the wraps and has started wearing medical grade 1 compression stockings.  The patient appears to be tolerating these well.  There are no reports of any chest pain or shortness of breath.  We will have the patient return in 2 months with noninvasive studies to evaluate progression of DVT.  We will also discuss anticoagulation at this time.  Otherwise patient is to continue with conservative therapy such as wearing medical grade 1 compression, elevation and ambulation when possible.  These instructions were sent to the patient's nursing facility.  2. Alzheimer's dementia with behavioral disturbance, unspecified timing of dementia onset [facility]) Patient unable to provide any useful history.  History is mainly provided by family present.  Patient currently lives in a nursing facility that is able to continue to care for her.  3. Essential hypertension Continue antihypertensive medications as already ordered, these medications have been reviewed and there are no changes at this time.    Current Outpatient Medications on File Prior to Visit  Medication Sig Dispense Refill  . acetaminophen (TYLENOL) 500 MG tablet Take 1,000 mg by mouth every 6 (six) hours as needed for mild pain or fever.    Marland Kitchen apixaban (ELIQUIS) 5 MG TABS tablet Take 1 tablet (5 mg total) by mouth 2 (two) times daily. 60 tablet 0  . cholecalciferol (VITAMIN D) 25 MCG (1000 UT) tablet Take 2,000 Units by mouth daily.     Marland Kitchen diltiazem (CARDIZEM CD) 180 MG 24 hr capsule Take 180 mg by mouth daily.    . ferrous sulfate 325 (65 FE) MG tablet Take 325 mg by mouth daily.    Marland Kitchen FLUoxetine (PROZAC) 20 MG tablet Take 20 mg by mouth daily.     Marland Kitchen gabapentin (NEURONTIN) 100 MG capsule Take 100 mg by mouth at bedtime.    . Liniments (BLUE-EMU SUPER STRENGTH) CREA Apply 1 application topically every 12 (twelve) hours as needed (knee pain).    . melatonin 3 MG TABS tablet Take 6 mg by mouth at bedtime.     . NONFORMULARY OR COMPOUNDED ITEM Apply 1 application topically in the morning, at noon, and at bedtime. ** LORAZEPAM 1mg /ml topical gel**    . NONFORMULARY OR COMPOUNDED ITEM Apply 1 application topically 4 (four) times daily as needed (agitation). ** LORAZEPAM 1mg /ml topical gel**    . polyethylene glycol (MIRALAX / GLYCOLAX) 17 g packet Take 17 g by mouth daily.    Marland Kitchen  pramipexole (MIRAPEX) 0.25 MG tablet Take 0.25 mg by mouth 2 (two) times daily.    . QUEtiapine (SEROQUEL) 50 MG tablet Take 50 mg by mouth 3 (three) times daily.    . Skin Protectants, Misc. (EUCERIN) cream Apply topically as needed for dry skin.    Marland Kitchen traMADol (ULTRAM) 50 MG tablet Take by mouth every 6 (six) hours as needed.    . traZODone (DESYREL) 50 MG tablet Take 25 mg by mouth every 12 (twelve) hours as needed (anxiety).    . vitamin B-12 (CYANOCOBALAMIN) 1000 MCG tablet Take 1,000 mcg by mouth daily.    Marland Kitchen apixaban (ELIQUIS) 5 MG TABS tablet Take 2 tablets (10 mg total) by mouth 2 (two) times daily for 12 doses. 24 tablet 0   No current facility-administered medications on file prior to visit.    There are no Patient Instructions on file for this visit. No follow-ups on file.   Kris Hartmann, NP

## 2020-08-24 ENCOUNTER — Other Ambulatory Visit (INDEPENDENT_AMBULATORY_CARE_PROVIDER_SITE_OTHER): Payer: Self-pay | Admitting: Nurse Practitioner

## 2020-08-24 DIAGNOSIS — I82402 Acute embolism and thrombosis of unspecified deep veins of left lower extremity: Secondary | ICD-10-CM

## 2020-08-28 ENCOUNTER — Encounter (INDEPENDENT_AMBULATORY_CARE_PROVIDER_SITE_OTHER): Payer: Medicare Other

## 2020-08-28 ENCOUNTER — Ambulatory Visit (INDEPENDENT_AMBULATORY_CARE_PROVIDER_SITE_OTHER): Payer: Medicare Other | Admitting: Nurse Practitioner

## 2021-01-23 ENCOUNTER — Other Ambulatory Visit: Payer: Self-pay

## 2021-01-23 ENCOUNTER — Emergency Department
Admission: EM | Admit: 2021-01-23 | Discharge: 2021-01-23 | Disposition: A | Discharge status: Home / Self Care | Payer: Medicare Other | Attending: Emergency Medicine | Admitting: Emergency Medicine

## 2021-01-23 DIAGNOSIS — Z20822 Contact with and (suspected) exposure to covid-19: Secondary | ICD-10-CM | POA: Diagnosis not present

## 2021-01-23 DIAGNOSIS — R002 Palpitations: Secondary | ICD-10-CM | POA: Diagnosis present

## 2021-01-23 DIAGNOSIS — F0390 Unspecified dementia without behavioral disturbance: Secondary | ICD-10-CM | POA: Insufficient documentation

## 2021-01-23 DIAGNOSIS — Z7901 Long term (current) use of anticoagulants: Secondary | ICD-10-CM | POA: Insufficient documentation

## 2021-01-23 DIAGNOSIS — I471 Supraventricular tachycardia: Secondary | ICD-10-CM | POA: Insufficient documentation

## 2021-01-23 DIAGNOSIS — I10 Essential (primary) hypertension: Secondary | ICD-10-CM | POA: Diagnosis not present

## 2021-01-23 DIAGNOSIS — Z79899 Other long term (current) drug therapy: Secondary | ICD-10-CM | POA: Insufficient documentation

## 2021-01-23 LAB — CBC
HCT: 43.1 % (ref 36.0–46.0)
Hemoglobin: 14 g/dL (ref 12.0–15.0)
MCH: 28.8 pg (ref 26.0–34.0)
MCHC: 32.5 g/dL (ref 30.0–36.0)
MCV: 88.7 fL (ref 80.0–100.0)
Platelets: 211 10*3/uL (ref 150–400)
RBC: 4.86 MIL/uL (ref 3.87–5.11)
RDW: 12.8 % (ref 11.5–15.5)
WBC: 8.3 10*3/uL (ref 4.0–10.5)
nRBC: 0 % (ref 0.0–0.2)

## 2021-01-23 LAB — URINALYSIS, COMPLETE (UACMP) WITH MICROSCOPIC
Bilirubin Urine: NEGATIVE
Glucose, UA: NEGATIVE mg/dL
Ketones, ur: NEGATIVE mg/dL
Nitrite: NEGATIVE
Protein, ur: NEGATIVE mg/dL
Specific Gravity, Urine: 1.006 (ref 1.005–1.030)
Squamous Epithelial / HPF: NONE SEEN (ref 0–5)
pH: 6 (ref 5.0–8.0)

## 2021-01-23 LAB — COMPREHENSIVE METABOLIC PANEL
ALT: 14 U/L (ref 0–44)
AST: 25 U/L (ref 15–41)
Albumin: 4 g/dL (ref 3.5–5.0)
Alkaline Phosphatase: 70 U/L (ref 38–126)
Anion gap: 13 (ref 5–15)
BUN: 21 mg/dL (ref 8–23)
CO2: 23 mmol/L (ref 22–32)
Calcium: 9.2 mg/dL (ref 8.9–10.3)
Chloride: 102 mmol/L (ref 98–111)
Creatinine, Ser: 0.96 mg/dL (ref 0.44–1.00)
GFR, Estimated: 56 mL/min — ABNORMAL LOW (ref >60)
Glucose, Bld: 96 mg/dL (ref 70–99)
Potassium: 4.7 mmol/L (ref 3.5–5.1)
Sodium: 138 mmol/L (ref 135–145)
Total Bilirubin: 0.4 mg/dL (ref 0.3–1.2)
Total Protein: 7.3 g/dL (ref 6.5–8.1)

## 2021-01-23 LAB — SARS CORONAVIRUS 2 BY RT PCR (HOSPITAL ORDER, PERFORMED IN ~~LOC~~ HOSPITAL LAB): SARS Coronavirus 2: NEGATIVE

## 2021-01-23 LAB — TROPONIN I (HIGH SENSITIVITY): Troponin I (High Sensitivity): 23 ng/L — ABNORMAL HIGH (ref <18)

## 2021-01-23 MED ORDER — ADENOSINE 6 MG/2ML IV SOLN
6.0000 mg | Freq: Once | INTRAVENOUS | Status: AC
  Administered 2021-01-23: 6 mg via INTRAVENOUS
  Filled 2021-01-23: qty 2

## 2021-01-23 NOTE — ED Provider Notes (Signed)
[facility] Emergency Department Provider Note  Time seen: 5:55 PM  I have reviewed the triage vital signs and the nursing notes.   HISTORY  Chief Complaint Tachycardia and Drooling   HPI Paige Vasquez is a 87 y.o. female with a past medical history of dementia, hypertension, prior DVT, gastric reflux, paroxysmal SVT, presents to the emergency department for rapid heart rate and report of drooling.  According to EMS report patient is coming from a nursing facility where she was noted to have a rapid heart rate around 150 bpm no mild drooling.  No drooling upon arrival.  Patient is answering questions but she is confused, has dementia at baseline.  Is awake alert, no distress.  Specifically denies any chest pain, unaware of her fast heart rate which is currently around 150 bpm.   Past Medical History:  Diagnosis Date  . Cystitis   . GERD (gastroesophageal reflux disease)   . HTN (hypertension)   . RLS (restless legs syndrome)     Patient Active Problem List   Diagnosis Date Noted  . Benign essential hypertension 05/17/2020  . DVT (deep venous thrombosis) ([place]) 04/28/2020  . Leukocytosis 04/28/2020  . Depression with anxiety 04/28/2020  . Diarrhea 04/28/2020  . Mild protein-calorie malnutrition ([place]) 01/31/2019  . Bradycardia 01/22/2019  . History of patellar fracture 09/03/2018  . Contusion of left knee 07/20/2018  . UTI (urinary tract infection) 07/02/2018  . Falls 07/02/2018  . HTN (hypertension) 07/02/2018  . GERD (gastroesophageal reflux disease) 07/02/2018  . RLS (restless legs syndrome) 07/02/2018  . Alzheimer's dementia with behavioral disturbance ([place]) 06/15/2018  . Paroxysmal SVT (supraventricular tachycardia) ([place]) 09/22/2017  . Scoliosis of thoracic spine 12/09/2016  . Medicare annual wellness visit, initial 08/11/2016  . Chronic osteoarthritis 06/16/2016  . Interstitial cystitis 02/13/2016  . Dysuria 10/25/2015  . Incomplete emptying of  bladder 09/27/2015  . 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 Lumbar disc disease 07/26/2014  . Osteoporosis 04/19/2014    Past Surgical History:  Procedure Laterality Date  . ABDOMINAL HYSTERECTOMY    . NASAL SINUS SURGERY      Prior to Admission medications   Medication Sig Start Date End Date Taking? Authorizing Provider  acetaminophen (TYLENOL) 500 MG tablet Take 1,000 mg by mouth every 6 (six) hours as needed for mild pain or fever.    [provider]  apixaban (ELIQUIS) 5 MG TABS tablet Take 2 tablets (10 mg total) by mouth 2 (two) times daily for 12 doses. 04/30/20 05/06/20  Sharen Hones, MD  apixaban (ELIQUIS) 5 MG TABS tablet Take 1 tablet (5 mg total) by mouth 2 (two) times daily. 05/05/20   Sharen Hones, MD  cholecalciferol (VITAMIN D) 25 MCG (1000 UT) tablet Take 2,000 Units by mouth daily.     [provider]  diltiazem (CARDIZEM CD) 180 MG 24 hr capsule Take 180 mg by mouth daily.    [provider]  ferrous sulfate 325 (65 FE) MG tablet Take 325 mg by mouth daily.    [provider]  FLUoxetine (PROZAC) 20 MG tablet Take 20 mg by mouth daily.     [provider]  gabapentin (NEURONTIN) 100 MG capsule Take 100 mg by mouth at bedtime.    [provider]  Liniments (BLUE-EMU SUPER STRENGTH) CREA Apply 1 application topically every 12 (twelve) hours as needed (knee pain).    [provider]  melatonin 3 MG TABS tablet Take 6 mg by mouth at  bedtime.    [provider]  NONFORMULARY OR COMPOUNDED ITEM Apply 1 application topically in the morning, at noon, and at bedtime. ** LORAZEPAM 1mg /ml topical gel**    [provider]  NONFORMULARY OR COMPOUNDED ITEM Apply 1 application topically 4 (four) times daily as needed (agitation). ** LORAZEPAM 1mg /ml topical gel**    [provider]  polyethylene glycol (MIRALAX / GLYCOLAX) 17 g packet Take 17 g by mouth daily.    [provider]  pramipexole (MIRAPEX) 0.25 MG tablet Take 0.25 mg by mouth 2 (two) times daily.    [provider]  QUEtiapine (SEROQUEL) 50 MG tablet Take 50 mg by mouth 3 (three) times daily.    [provider]  Skin Protectants, Misc. (EUCERIN) cream Apply topically as needed for dry skin.    [provider]  traMADol (ULTRAM) 50 MG tablet Take by mouth every 6 (six) hours as needed.    [provider]  traZODone (DESYREL) 50 MG tablet Take 25 mg by mouth every 12 (twelve) hours as needed (anxiety).    [provider]  vitamin B-12 (CYANOCOBALAMIN) 1000 MCG tablet Take 1,000 mcg by mouth daily.    [provider]    No Known Allergies  Family History  Problem Relation Age of Onset  . Colon cancer Mother   . Emphysema Father     Social History Social History   Tobacco Use  . Smoking status: Never Smoker  . Smokeless tobacco: Never Used    Review of Systems Unable to obtain an adequate/accurate review of systems secondary to baseline dementia. ____________________________________________   PHYSICAL EXAM:  VITAL SIGNS: ED Triage Vitals  Enc Vitals Group     BP 01/23/21 1751 (!) 162/91     Pulse Rate 01/23/21 1751 (!) 147     Resp 01/23/21 1751 (!) 21     Temp --      Temp src --      SpO2 01/23/21 1751 97 %     Weight 01/23/21 1752 116 lb 13.5 oz (53 kg)     Height 01/23/21 1752 5\' 4"  (1.626 m)     Head Circumference --      Peak Flow --      Pain Score 01/23/21 1752 10     Pain Loc --      Pain Edu? --      Excl. in [place]? --    Constitutional: Awake alert, calm cooperative and pleasant.  Confused but has dementia at baseline. Eyes: Normal exam ENT      Head: Normocephalic and atraumatic.      Mouth/Throat: Mucous membranes are moist. Cardiovascular: Regular rhythm rate around 150 bpm. Respiratory: Normal respiratory effort without tachypnea nor retractions. Breath sounds are clear  Gastrointestinal: Soft and  nontender. No distention.   Musculoskeletal: Nontender with normal range of motion in all extremities.  Neurologic:  Normal speech and language. No gross focal neurologic deficits  Psychiatric: Mood and affect are normal.   ____________________________________________    EKG  EKG viewed and interpreted by myself shows tachycardia around 144 bpm with a narrow QRS, normal axis, normal intervals with nonspecific ST changes.  ____________________________________________   INITIAL IMPRESSION / ASSESSMENT AND PLAN / ED COURSE  Pertinent labs & imaging results that were available during my care of the patient were reviewed by me and considered in my medical decision making (see chart for details).   Patient presents emergency department for rapid heart rate complaint of  drooling.  No drooling here.  Patient does continue to have a rapid heart rate around 150 bpm.  Appears very regular, no obvious P waves identified on EKG possibly SVT versus a flutter with a 2-1 block.  We will attempt 6 mg adenosine.  We will check labs and continue to closely monitor.  After 6 mg of adenosine patient had an approximate 5-second sinus pause followed by a normal sinus rhythm currently breathing around 90 bpm.  Lab work pending.    Lab work is largely reassuring very slight troponin elevation 23 which would be expected after an episode of SVT.  Patient's heart rate remains around 60 bpm.  Patient appears well.  I believe the patient is safe for discharge home given her reassuring work-up after conversion back to normal sinus rhythm  Paige Vasquez was evaluated in Emergency Department on 01/23/2021 for the symptoms described in the history of present illness. She was evaluated in the context of the global COVID-19 pandemic, which necessitated consideration that the patient might be at risk for infection with the SARS-CoV-2 virus that causes COVID-19. Institutional protocols and algorithms that pertain to the  evaluation of patients at risk for COVID-19 are in a state of rapid change based on information released by regulatory bodies including the CDC and federal and state organizations. These policies and algorithms were followed during the patient's care in the ED.  CRITICAL CARE Performed by: Harvest Dark   Total critical care time: 30 minutes  Critical care time was exclusive of separately billable procedures and treating other patients.  Critical care was necessary to treat or prevent imminent or life-threatening deterioration.  Critical care was time spent personally by me on the following activities: development of treatment plan with patient and/or surrogate as well as nursing, discussions with consultants, evaluation of patient's response to treatment, examination of patient, obtaining history from patient or surrogate, ordering and performing treatments and interventions, ordering and review of laboratory studies, ordering and review of radiographic studies, pulse oximetry and re-evaluation of patient's condition.  ____________________________________________   FINAL CLINICAL IMPRESSION(S) / ED DIAGNOSES  Supraventricular tachycardia   Harvest Dark, MD 01/23/21 1921

## 2021-01-23 NOTE — ED Triage Notes (Signed)
Pt comes ems from [facility] for fast heartrate and drooling. Pt unsure if any pain. Pt neck turned to side and unsure if that's normal. Pt speaking normally and not drooling at this time. C/o rectum spasms.

## 2021-01-23 NOTE — ED Notes (Addendum)
Daughter at bedside reports pt complaining of burning in genital area. External urinary catheter removed. Urine noted in brief. Small spot of pink blood noted in brief. Informed daughter pt may be having pain following insertion of in and out catheter previously during visit. Genitalia intact with no bleeding noted at this time. Brief changed and pericare performed with wipes. Daughter informed EMS has been contacted for pt discharge transportation at this time.

## 2021-01-23 NOTE — ED Notes (Signed)
Pt presents to ED with c/'o of having a fast heart rate. Pt presents tachycardiac with a rate in the 140-150's. Pt is confused at baseline and resides at a memory care unit. Pt foes not express pain, just burning" while pointing to her rectum". This RN and another RN visualized buttocks and rectum area and did not see a source for pain. Pt denies N/V/D. This RN is unsure of cognitive baseline at this time per EMS family is coming. NAD noted at this time.

## 2021-04-28 ENCOUNTER — Emergency Department: Payer: Medicare Other

## 2021-04-28 ENCOUNTER — Other Ambulatory Visit: Payer: Self-pay

## 2021-04-28 ENCOUNTER — Emergency Department
Admission: EM | Admit: 2021-04-28 | Discharge: 2021-04-28 | Disposition: A | Discharge status: Home / Self Care | Payer: Medicare Other | Attending: Emergency Medicine | Admitting: Emergency Medicine

## 2021-04-28 DIAGNOSIS — W19XXXA Unspecified fall, initial encounter: Secondary | ICD-10-CM | POA: Diagnosis not present

## 2021-04-28 DIAGNOSIS — M25551 Pain in right hip: Secondary | ICD-10-CM | POA: Diagnosis not present

## 2021-04-28 DIAGNOSIS — F0390 Unspecified dementia without behavioral disturbance: Secondary | ICD-10-CM | POA: Insufficient documentation

## 2021-04-28 DIAGNOSIS — Z7901 Long term (current) use of anticoagulants: Secondary | ICD-10-CM | POA: Diagnosis not present

## 2021-04-28 DIAGNOSIS — Z8551 Personal history of malignant neoplasm of bladder: Secondary | ICD-10-CM | POA: Diagnosis not present

## 2021-04-28 DIAGNOSIS — Z79899 Other long term (current) drug therapy: Secondary | ICD-10-CM | POA: Insufficient documentation

## 2021-04-28 DIAGNOSIS — Y92129 Unspecified place in nursing home as the place of occurrence of the external cause: Secondary | ICD-10-CM | POA: Insufficient documentation

## 2021-04-28 DIAGNOSIS — I10 Essential (primary) hypertension: Secondary | ICD-10-CM | POA: Diagnosis not present

## 2021-04-28 DIAGNOSIS — R0781 Pleurodynia: Secondary | ICD-10-CM | POA: Diagnosis not present

## 2021-04-28 NOTE — ED Notes (Signed)
Charge RN notified that pt becoming more restless, multiple attempts to crawl out of bed. Requested 1:1 sitter, ED tech at bedside.

## 2021-04-28 NOTE — ED Notes (Signed)
Pt laying in bed speaking to sitter

## 2021-04-28 NOTE — ED Notes (Signed)
Pt to ED from memory care unit for multiple unwitnessed falls. Pt hx of dementia, poor historian. Pt c/o left leg pain and posterior head pain at this time.

## 2021-04-28 NOTE — ED Notes (Signed)
Pt assisted onto bedside commode d/t pt requesting to use restroom and not tolerating purewick. Pt assisted back into bed without incident. Pt's daughter at bedside. Pt denies needs at this time, states "when do I get to go home?". Call bell in reach, pt's daughter denies any questions at this time

## 2021-04-28 NOTE — Discharge Instructions (Signed)

## 2021-04-28 NOTE — ED Notes (Signed)
[facility] to transport to [facility] after shift change.

## 2021-04-28 NOTE — ED Notes (Signed)
Pt unable to sign for discharge d/t dementia 

## 2021-04-28 NOTE — ED Provider Notes (Signed)
[facility] Emergency Department Provider Note  ____________________________________________   Event Date/Time   First MD Initiated Contact with Patient 04/28/21 [phone]     (approximate)  I have reviewed the triage vital signs and the nursing notes.   HISTORY  Chief Complaint No chief complaint on file.  Level 5 caveat:  history/ROS limited by chronic dementia  HPI Paige Vasquez is a 87 y.o. female with dementia who comes from her skilled nursing facility after apparently having an unwitnessed fall.  At times the patient will say that the back of her head hurts and that she struck it on something, other times she says she has no pain.  She also at times will complain of pain in her right hip as well as the right lower part of her rib cage.  She is not having any trouble breathing although initially the facility reported that she was.  She is in no distress at this time and is awake and alert and conversant and spite of her memory deficits.  She currently is reporting no pain and no shortness of breath.         Past Medical History:  Diagnosis Date  . Cystitis   . GERD (gastroesophageal reflux disease)   . HTN (hypertension)   . RLS (restless legs syndrome)     Patient Active Problem List   Diagnosis Date Noted  . Benign essential hypertension 05/17/2020  . DVT (deep venous thrombosis) (HCC) 04/28/2020  . Leukocytosis 04/28/2020  . Depression with anxiety 04/28/2020  . Diarrhea 04/28/2020  . Mild protein-calorie malnutrition (HCC) 01/31/2019  . Bradycardia 01/22/2019  . History of patellar fracture 09/03/2018  . Contusion of left knee 07/20/2018  . UTI (urinary tract infection) 07/02/2018  . Falls 07/02/2018  . HTN (hypertension) 07/02/2018  . GERD (gastroesophageal reflux disease) 07/02/2018  . RLS (restless legs syndrome) 07/02/2018  . Alzheimer's dementia with behavioral disturbance (HCC) 06/15/2018  . Paroxysmal SVT (supraventricular  tachycardia) (HCC) 09/22/2017  . Scoliosis of thoracic spine 12/09/2016  . Medicare annual wellness visit, initial 08/11/2016  . Chronic osteoarthritis 06/16/2016  . Interstitial cystitis 02/13/2016  . Dysuria 10/25/2015  . Incomplete emptying of bladder 09/27/2015  . 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 Lumbar disc disease 07/26/2014  . Osteoporosis 04/19/2014    Past Surgical History:  Procedure Laterality Date  . ABDOMINAL HYSTERECTOMY    . NASAL SINUS SURGERY      Prior to Admission medications   Medication Sig Start Date End Date Taking? Authorizing Provider  acetaminophen (TYLENOL) 500 MG tablet Take 1,000 mg by mouth every 6 (six) hours as needed for mild pain or fever.    [provider]  apixaban (ELIQUIS) 5 MG TABS tablet Take 2 tablets (10 mg total) by mouth 2 (two) times daily for 12 doses. 04/30/20 05/06/20  Marrion CoyZhang, Dekui, MD  apixaban (ELIQUIS) 5 MG TABS tablet Take 1 tablet (5 mg total) by mouth 2 (two) times daily. 05/05/20   Marrion CoyZhang, Dekui, MD  cholecalciferol (VITAMIN D) 25 MCG (1000 UT) tablet Take 2,000 Units by mouth daily.     [provider]  diltiazem (CARDIZEM CD) 180 MG 24 hr capsule Take 180 mg by mouth daily.    [provider]  ferrous sulfate 325 (65 FE) MG tablet Take 325 mg by mouth daily.    [provider]  FLUoxetine (PROZAC) 20 MG tablet Take 20 mg by mouth daily.     [provider]  gabapentin (NEURONTIN) 100 MG capsule Take 100 mg by mouth at bedtime.    [provider]  Liniments (BLUE-EMU SUPER STRENGTH) CREA Apply 1 application topically every 12 (twelve) hours as needed (knee pain).    [provider]  melatonin 3 MG TABS tablet Take 6 mg by mouth at bedtime.    [provider]  NONFORMULARY OR COMPOUNDED ITEM Apply 1 application topically in the morning, at noon, and at bedtime. ** LORAZEPAM 1mg /ml topical gel**    [provider]   NONFORMULARY OR COMPOUNDED ITEM Apply 1 application topically 4 (four) times daily as needed (agitation). ** LORAZEPAM 1mg /ml topical gel**    [provider]  polyethylene glycol (MIRALAX / GLYCOLAX) 17 g packet Take 17 g by mouth daily.    [provider]  pramipexole (MIRAPEX) 0.25 MG tablet Take 0.25 mg by mouth 2 (two) times daily.    [provider]  QUEtiapine (SEROQUEL) 50 MG tablet Take 50 mg by mouth 3 (three) times daily.    [provider]  Skin Protectants, Misc. (EUCERIN) cream Apply topically as needed for dry skin.    [provider]  traMADol (ULTRAM) 50 MG tablet Take by mouth every 6 (six) hours as needed.    [provider]  traZODone (DESYREL) 50 MG tablet Take 25 mg by mouth every 12 (twelve) hours as needed (anxiety).    [provider]  vitamin B-12 (CYANOCOBALAMIN) 1000 MCG tablet Take 1,000 mcg by mouth daily.    [provider]    Allergies Patient has no known allergies.  Family History  Problem Relation Age of Onset  . Colon cancer Mother   . Emphysema Father     Social History Social History   Tobacco Use  . Smoking status: Never Smoker  . Smokeless tobacco: Never Used    Review of Systems Level 5 caveat:  history/ROS limited by chronic dementia  ____________________________________________   PHYSICAL EXAM:  VITAL SIGNS: ED Triage Vitals  Enc Vitals Group     BP 04/28/21 0323 (!) 145/62     Pulse Rate 04/28/21 0323 (!) 57     Resp 04/28/21 0331 20     Temp 04/28/21 0331 98.2 F (36.8 C)     Temp Source 04/28/21 0331 Oral     SpO2 04/28/21 0323 98 %     Weight --      Height --      Head Circumference --      Peak Flow --      Pain Score --      Pain Loc --      Pain Edu? --      Excl. in [place]? --     Constitutional: Alert and oriented to self only. Eyes: Conjunctivae are normal.  Head: Atraumatic. Nose: No congestion/rhinnorhea. Mouth/Throat: Patient is  wearing a mask. Neck: No stridor.  No meningeal signs.   Cardiovascular: Normal rate, regular rhythm. Good peripheral circulation. Respiratory: Normal respiratory effort.  No retractions. Gastrointestinal: Soft and nontender. No distention.  Musculoskeletal: I am able to fully range the patient's arms and legs but she reports some pain in her right hip when I do so with her right lower extremity.  No apparent pain or discomfort in her right arm. Neurologic:  Normal speech and language. No gross focal neurologic deficits are appreciated.  Skin:  Skin is warm, dry and intact.  I do not see any evidence of contusion on the right lateral part of  her rib cage, nor do I see any contusions or swelling on her right thigh or hip.  ____________________________________________   LABS (all labs ordered are listed, but only abnormal results are displayed)  Labs Reviewed - No data to display ____________________________________________  EKG  ED ECG REPORT I, Hinda Kehr, the attending physician, personally viewed and interpreted this ECG.  Date: 04/28/2021 EKG Time: 3:27 AM Rate: 58 Rhythm: Borderline sinus bradycardia QRS Axis: normal Intervals: normal ST/T Wave abnormalities: normal Narrative Interpretation: no evidence of acute ischemia ____________________________________________  RADIOLOGY I, Hinda Kehr, personally viewed and evaluated these images (plain radiographs) as part of my medical decision making, as well as reviewing the written report by the radiologist.  ED MD interpretation: No acute abnormalities identified on CTs nor x-rays.  Official radiology report(s): DG Chest 2 View  Result Date: 04/28/2021 CLINICAL DATA:  Pain of right side status post fall. EXAM: CHEST - 2 VIEW COMPARISON:  Chest x-ray 01/25/2019 FINDINGS: The heart size and mediastinal contours are unchanged. Atherosclerotic plaque. Large volume hiatal hernia. Biapical pleural/pulmonary scarring. No focal  consolidation. No pulmonary edema. Cannot exclude trace right pleural effusion. No left pleural effusion. No pneumothorax. No acute osseous abnormality. IMPRESSION: 1. No active cardiopulmonary disease. 2. Large hiatal hernia. Electronically Signed   By: Iven Finn M.D.   On: 04/28/2021 03:55   CT Head Wo Contrast  Result Date: 04/28/2021 CLINICAL DATA:  Trauma EXAM: CT HEAD WITHOUT CONTRAST CT CERVICAL SPINE WITHOUT CONTRAST TECHNIQUE: Multidetector CT imaging of the head and cervical spine was performed following the standard protocol without intravenous contrast. Multiplanar CT image reconstructions of the cervical spine were also generated. COMPARISON:  CT head cervical spine 02/28/2020 FINDINGS: CT HEAD FINDINGS Brain: Cerebral ventricle sizes are concordant with the degree of cerebral volume loss. Patchy and confluent areas of decreased attenuation are noted throughout the deep and periventricular white matter of the cerebral hemispheres bilaterally, compatible with chronic microvascular ischemic disease. No evidence of large-territorial acute infarction. No parenchymal hemorrhage. No mass lesion. No extra-axial collection. No mass effect or midline shift. No hydrocephalus. Basilar cisterns are patent. Vascular: No hyperdense vessel. Skull: No acute fracture or focal lesion. Sinuses/Orbits: Paranasal sinuses and mastoid air cells are clear. Bilateral lens replacement. Otherwise the orbits are unremarkable. Other: None. CT CERVICAL SPINE FINDINGS Alignment: Similar-appearing grade 1 anterolisthesis of C3 on C4. Similar-appearing trace retrolisthesis of C4 on C5 and C5 on C6. Skull base and vertebrae: Multilevel degenerative changes of the spine. No acute fracture. No aggressive appearing focal osseous lesion or focal pathologic process. Soft tissues and spinal canal: No prevertebral fluid or swelling. No visible canal hematoma. Upper chest: Emphysematous changes. Other: None. IMPRESSION: 1. No acute  intracranial abnormality. 2. No acute displaced fracture or traumatic listhesis of the cervical spine. Electronically Signed   By: Iven Finn M.D.   On: 04/28/2021 04:10   CT Cervical Spine Wo Contrast  Result Date: 04/28/2021 CLINICAL DATA:  Trauma EXAM: CT HEAD WITHOUT CONTRAST CT CERVICAL SPINE WITHOUT CONTRAST TECHNIQUE: Multidetector CT imaging of the head and cervical spine was performed following the standard protocol without intravenous contrast. Multiplanar CT image reconstructions of the cervical spine were also generated. COMPARISON:  CT head cervical spine 02/28/2020 FINDINGS: CT HEAD FINDINGS Brain: Cerebral ventricle sizes are concordant with the degree of cerebral volume loss. Patchy and confluent areas of decreased attenuation are noted throughout the deep and periventricular white matter of the cerebral hemispheres bilaterally, compatible with chronic microvascular ischemic disease. No evidence of  large-territorial acute infarction. No parenchymal hemorrhage. No mass lesion. No extra-axial collection. No mass effect or midline shift. No hydrocephalus. Basilar cisterns are patent. Vascular: No hyperdense vessel. Skull: No acute fracture or focal lesion. Sinuses/Orbits: Paranasal sinuses and mastoid air cells are clear. Bilateral lens replacement. Otherwise the orbits are unremarkable. Other: None. CT CERVICAL SPINE FINDINGS Alignment: Similar-appearing grade 1 anterolisthesis of C3 on C4. Similar-appearing trace retrolisthesis of C4 on C5 and C5 on C6. Skull base and vertebrae: Multilevel degenerative changes of the spine. No acute fracture. No aggressive appearing focal osseous lesion or focal pathologic process. Soft tissues and spinal canal: No prevertebral fluid or swelling. No visible canal hematoma. Upper chest: Emphysematous changes. Other: None. IMPRESSION: 1. No acute intracranial abnormality. 2. No acute displaced fracture or traumatic listhesis of the cervical spine. Electronically  Signed   By: Iven Finn M.D.   On: 04/28/2021 04:10   DG Hip Unilat W or Wo Pelvis 2-3 Views Right  Result Date: 04/28/2021 CLINICAL DATA:  Fall.  Right side pain peer EXAM: DG HIP (WITH OR WITHOUT PELVIS) 2-3V RIGHT COMPARISON:  CT pelvis 10/01/2006 FINDINGS: There is no evidence of right hip fracture or dislocation. Frontal view of the pelvis demonstrates no diastasis or definite acute displaced fracture. Limited evaluation of the sacrum due to overlying bowel gas. Degenerative changes of the visualized lower lumbar spine. No aggressive appearing focal bone abnormality. Surgical changes of bilateral inguinal hernia repair. IMPRESSION: Negative. Electronically Signed   By: Iven Finn M.D.   On: 04/28/2021 03:58    ____________________________________________   PROCEDURES   Procedure(s) performed (including Critical Care):  Procedures   ____________________________________________   INITIAL IMPRESSION / MDM / [place] / ED COURSE  As part of my medical decision making, I reviewed the following data within the [place] History obtained from family, Nursing notes reviewed and incorporated, Old chart reviewed, Radiograph reviewed  and Notes from prior ED visits   Differential diagnosis includes, but is not limited to, fracture, dislocation, intracranial bleeding, neurological injury, CVA, acute infection.  The patient has no consistent complaints or concerns but has variously reported some pain on the right side of her body as well as possibly hitting her head.  I will obtain CT scans of her head and cervical spine as well as x-rays of her chest and her pelvis and right hip.  She is currently in no distress and I anticipate discharge and outpatient follow-up but reportedly the daughter is on the way and I will talk to her about whether or not she would like [place] to perform additional evaluation.  However at this point I do not see any strong indication  for obtaining lab work.  Her EKG is nonischemic.         Clinical Course as of 04/28/21 0810  Sun Apr 28, 2021  0523 The patient's daughter is now at the bedside.  She is reassured by the way her mother has been acting and is not concerned about any other issue.  I updated her about the results of the CT scans and x-rays which are reassuring; in addition to no acute abnormalities identified by the radiologist on head CT and cervical spine CT, I personally reviewed the patient's imaging and agree with the radiologist's interpretation that there are no acute fractures identified on chest x-ray nor hip/pelvis x-rays.  I discussed whether or not to do labs with the daughter and she is comfortable not doing so since the patient  has no other concerning signs or symptoms.  The patient is enthusiastically eating a meal tray currently and she appears to be appropriate for discharge back to her facility. [CF]    Clinical Course User Index [CF] Hinda Kehr, MD     ____________________________________________  FINAL CLINICAL IMPRESSION(S) / ED DIAGNOSES  Final diagnoses:  Fall, initial encounter  Dementia without behavioral disturbance, unspecified dementia type ([place])     MEDICATIONS GIVEN DURING THIS VISIT:  Medications - No data to display   ED Discharge Orders    None      *Please note:  Paige Vasquez was evaluated in Emergency Department on 04/28/2021 for the symptoms described in the history of present illness. She was evaluated in the context of the global COVID-19 pandemic, which necessitated consideration that the patient might be at risk for infection with the SARS-CoV-2 virus that causes COVID-19. Institutional protocols and algorithms that pertain to the evaluation of patients at risk for COVID-19 are in a state of rapid change based on information released by regulatory bodies including the CDC and federal and state organizations. These policies and algorithms were followed  during the patient's care in the ED.  Some ED evaluations and interventions may be delayed as a result of limited staffing during and after the pandemic.*  Note:  This document was prepared using Dragon voice recognition software and may include unintentional dictation errors.   Hinda Kehr, MD 04/28/21 [phone]

## 2021-04-28 NOTE — ED Notes (Signed)
Pt provided with boxed lunch and water with okay from Dr. Karma Greaser.

## 2021-05-16 ENCOUNTER — Emergency Department
Admission: EM | Admit: 2021-05-16 | Discharge: 2021-05-17 | Disposition: A | Discharge status: Home / Self Care | Payer: Medicare Other | Attending: Emergency Medicine | Admitting: Emergency Medicine

## 2021-05-16 ENCOUNTER — Emergency Department: Payer: Medicare Other

## 2021-05-16 ENCOUNTER — Other Ambulatory Visit: Payer: Self-pay

## 2021-05-16 DIAGNOSIS — R410 Disorientation, unspecified: Secondary | ICD-10-CM | POA: Diagnosis not present

## 2021-05-16 DIAGNOSIS — I4891 Unspecified atrial fibrillation: Secondary | ICD-10-CM | POA: Diagnosis not present

## 2021-05-16 DIAGNOSIS — I10 Essential (primary) hypertension: Secondary | ICD-10-CM | POA: Diagnosis not present

## 2021-05-16 DIAGNOSIS — Z79899 Other long term (current) drug therapy: Secondary | ICD-10-CM | POA: Insufficient documentation

## 2021-05-16 DIAGNOSIS — Z8551 Personal history of malignant neoplasm of bladder: Secondary | ICD-10-CM | POA: Diagnosis not present

## 2021-05-16 DIAGNOSIS — Z7901 Long term (current) use of anticoagulants: Secondary | ICD-10-CM | POA: Insufficient documentation

## 2021-05-16 DIAGNOSIS — R000 Tachycardia, unspecified: Secondary | ICD-10-CM | POA: Diagnosis present

## 2021-05-16 DIAGNOSIS — Y92129 Unspecified place in nursing home as the place of occurrence of the external cause: Secondary | ICD-10-CM | POA: Insufficient documentation

## 2021-05-16 DIAGNOSIS — F0390 Unspecified dementia without behavioral disturbance: Secondary | ICD-10-CM | POA: Insufficient documentation

## 2021-05-16 DIAGNOSIS — W19XXXA Unspecified fall, initial encounter: Secondary | ICD-10-CM | POA: Diagnosis not present

## 2021-05-16 LAB — CBC WITH DIFFERENTIAL/PLATELET
Abs Immature Granulocytes: 0.03 10*3/uL (ref 0.00–0.07)
Basophils Absolute: 0 10*3/uL (ref 0.0–0.1)
Basophils Relative: 0 %
Eosinophils Absolute: 0.1 10*3/uL (ref 0.0–0.5)
Eosinophils Relative: 1 %
HCT: 36.4 % (ref 36.0–46.0)
Hemoglobin: 12.1 g/dL (ref 12.0–15.0)
Immature Granulocytes: 0 %
Lymphocytes Relative: 17 %
Lymphs Abs: 1.2 10*3/uL (ref 0.7–4.0)
MCH: 29.1 pg (ref 26.0–34.0)
MCHC: 33.2 g/dL (ref 30.0–36.0)
MCV: 87.5 fL (ref 80.0–100.0)
Monocytes Absolute: 0.6 10*3/uL (ref 0.1–1.0)
Monocytes Relative: 8 %
Neutro Abs: 5.2 10*3/uL (ref 1.7–7.7)
Neutrophils Relative %: 74 %
Platelets: 175 10*3/uL (ref 150–400)
RBC: 4.16 MIL/uL (ref 3.87–5.11)
RDW: 13.1 % (ref 11.5–15.5)
WBC: 7.1 10*3/uL (ref 4.0–10.5)
nRBC: 0 % (ref 0.0–0.2)

## 2021-05-16 LAB — COMPREHENSIVE METABOLIC PANEL
ALT: 18 U/L (ref 0–44)
AST: 28 U/L (ref 15–41)
Albumin: 3.3 g/dL — ABNORMAL LOW (ref 3.5–5.0)
Alkaline Phosphatase: 62 U/L (ref 38–126)
Anion gap: 8 (ref 5–15)
BUN: 18 mg/dL (ref 8–23)
CO2: 26 mmol/L (ref 22–32)
Calcium: 8.8 mg/dL — ABNORMAL LOW (ref 8.9–10.3)
Chloride: 103 mmol/L (ref 98–111)
Creatinine, Ser: 0.82 mg/dL (ref 0.44–1.00)
GFR, Estimated: >60 mL/min (ref >60)
Glucose, Bld: 81 mg/dL (ref 70–99)
Potassium: 4.4 mmol/L (ref 3.5–5.1)
Sodium: 137 mmol/L (ref 135–145)
Total Bilirubin: 0.9 mg/dL (ref 0.3–1.2)
Total Protein: 5.8 g/dL — ABNORMAL LOW (ref 6.5–8.1)

## 2021-05-16 LAB — MAGNESIUM: Magnesium: 1.8 mg/dL (ref 1.7–2.4)

## 2021-05-16 MED ORDER — DILTIAZEM HCL 25 MG/5ML IV SOLN
5.0000 mg | Freq: Once | INTRAVENOUS | Status: AC
  Administered 2021-05-16: 5 mg via INTRAVENOUS
  Filled 2021-05-16: qty 5

## 2021-05-16 MED ORDER — PRAMIPEXOLE DIHYDROCHLORIDE 0.25 MG PO TABS
0.2500 mg | ORAL_TABLET | Freq: Once | ORAL | Status: AC
  Administered 2021-05-16: 0.25 mg via ORAL
  Filled 2021-05-16: qty 1

## 2021-05-16 MED ORDER — LACTATED RINGERS IV BOLUS
1000.0000 mL | Freq: Once | INTRAVENOUS | Status: AC
  Administered 2021-05-16: 1000 mL via INTRAVENOUS

## 2021-05-16 NOTE — ED Triage Notes (Signed)
EMS brings pt in from [place] for fall, confusion at baseline; no noted injuries or c/o

## 2021-05-16 NOTE — ED Notes (Signed)
ED Provider at bedside. 

## 2021-05-16 NOTE — ED Notes (Signed)
Patient noted to be in SVT, [place] PA notified. Patient to be moved to monitored bed. Patient alert and talking.

## 2021-05-16 NOTE — ED Notes (Signed)
Pt converted, repeat EKG completed and shown to doc.

## 2021-05-16 NOTE — ED Provider Notes (Signed)
[facility] Emergency Department Provider Note  ____________________________________________   Event Date/Time   First MD Initiated Contact with Patient 05/16/21 2133     (approximate)  I have reviewed the triage vital signs and the nursing notes.   HISTORY  Chief Complaint Fall and Tachycardia    HPI Paige Vasquez is a 87 y.o. female here with fall.  Patient has history of  fall.  Patient reportedly has mild dementia and confusion, which appears to be at baseline per the report to EMS from the staff.  Patient reportedly was found on the floor, unclear if she had a fall.  There was no obvious trauma.  She was noted to also be tachycardic.  On arrival to the ER, patient tachycardic to the 150s.  Patient denies any complaints.  She does not recall what happened or why she is here.  Her main complaint is that her restless legs are acting up.  Denies any pain.  Denies any shortness of breath.  Remainder of history limited due to dementia.       Past Medical History:  Diagnosis Date  . Cystitis   . GERD (gastroesophageal reflux disease)   . HTN (hypertension)   . RLS (restless legs syndrome)     Patient Active Problem List   Diagnosis Date Noted  . Benign essential hypertension 05/17/2020  . DVT (deep venous thrombosis) ([place]) 04/28/2020  . Leukocytosis 04/28/2020  . Depression with anxiety 04/28/2020  . Diarrhea 04/28/2020  . Mild protein-calorie malnutrition ([place]) 01/31/2019  . Bradycardia 01/22/2019  . History of patellar fracture 09/03/2018  . Contusion of left knee 07/20/2018  . UTI (urinary tract infection) 07/02/2018  . Falls 07/02/2018  . HTN (hypertension) 07/02/2018  . GERD (gastroesophageal reflux disease) 07/02/2018  . RLS (restless legs syndrome) 07/02/2018  . Alzheimer's dementia with behavioral disturbance ([name]) 06/15/2018  . Paroxysmal SVT (supraventricular tachycardia) ([place]) 09/22/2017  . Scoliosis of thoracic spine 12/09/2016   . Medicare annual wellness visit, initial 08/11/2016  . Chronic osteoarthritis 06/16/2016  . Interstitial cystitis 02/13/2016  . Dysuria 10/25/2015  . Incomplete emptying of bladder 09/27/2015  . 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 Lumbar disc disease 07/26/2014  . Osteoporosis 04/19/2014    Past Surgical History:  Procedure Laterality Date  . ABDOMINAL HYSTERECTOMY    . NASAL SINUS SURGERY      Prior to Admission medications   Medication Sig Start Date End Date Taking? Authorizing Provider  cholecalciferol (VITAMIN D) 25 MCG (1000 UT) tablet Take 2,000 Units by mouth daily.    Yes [provider]  acetaminophen (TYLENOL) 500 MG tablet Take 1,000 mg by mouth every 6 (six) hours as needed for mild pain or fever.    [provider]  apixaban (ELIQUIS) 5 MG TABS tablet Take 2 tablets (10 mg total) by mouth 2 (two) times daily for 12 doses. 04/30/20 05/06/20  Sharen Hones, MD  apixaban (ELIQUIS) 5 MG TABS tablet Take 1 tablet (5 mg total) by mouth 2 (two) times daily. 05/05/20   Sharen Hones, MD  diltiazem (CARDIZEM CD) 180 MG 24 hr capsule Take 180 mg by mouth daily.    [provider]  ferrous sulfate 325 (65 FE) MG tablet Take 325 mg by mouth daily.    [provider]  FLUoxetine (PROZAC) 20 MG tablet Take 20 mg by mouth daily.     [provider]  gabapentin (NEURONTIN) 100 MG capsule Take 100 mg by mouth at  bedtime.    [provider]  Liniments (BLUE-EMU SUPER STRENGTH) CREA Apply 1 application topically every 12 (twelve) hours as needed (knee pain).    [provider]  melatonin 3 MG TABS tablet Take 6 mg by mouth at bedtime.    [provider]  NONFORMULARY OR COMPOUNDED ITEM Apply 1 application topically in the morning, at noon, and at bedtime. ** LORAZEPAM 1mg /ml topical gel**    [provider]  NONFORMULARY OR COMPOUNDED ITEM Apply 1 application topically 4 (four) times  daily as needed (agitation). ** LORAZEPAM 1mg /ml topical gel**    [provider]  polyethylene glycol (MIRALAX / GLYCOLAX) 17 g packet Take 17 g by mouth daily.    [provider]  pramipexole (MIRAPEX) 0.25 MG tablet Take 0.25 mg by mouth 2 (two) times daily.    [provider]  QUEtiapine (SEROQUEL) 50 MG tablet Take 50 mg by mouth 3 (three) times daily.    [provider]  Skin Protectants, Misc. (EUCERIN) cream Apply topically as needed for dry skin.    [provider]  traMADol (ULTRAM) 50 MG tablet Take by mouth every 6 (six) hours as needed.    [provider]  traZODone (DESYREL) 50 MG tablet Take 25 mg by mouth every 12 (twelve) hours as needed (anxiety).    [provider]  vitamin B-12 (CYANOCOBALAMIN) 1000 MCG tablet Take 1,000 mcg by mouth daily.    [provider]    Allergies Patient has no known allergies.  Family History  Problem Relation Age of Onset  . Colon cancer Mother   . Emphysema Father     Social History Social History   Tobacco Use  . Smoking status: Never Smoker  . Smokeless tobacco: Never Used    Review of Systems  Review of Systems  Unable to perform ROS: Dementia     ____________________________________________  PHYSICAL EXAM:      VITAL SIGNS: ED Triage Vitals  Enc Vitals Group     BP 05/16/21 2115 (!) 88/60     Pulse Rate 05/16/21 2115 (!) 153     Resp 05/16/21 2122 18     Temp 05/16/21 2122 98 F (36.7 C)     Temp Source 05/16/21 2122 Oral     SpO2 05/16/21 2059 94 %     Weight 05/16/21 2134 110 lb 3.7 oz (50 kg)     Height 05/16/21 2134 5\' 4"  (1.626 m)     Head Circumference --      Peak Flow --      Pain Score 05/16/21 2134 0     Pain Loc --      Pain Edu? --      Excl. in [place]? --      Physical Exam Vitals and nursing note reviewed.  Constitutional:      General: She is not in acute distress.    Appearance: She is well-developed.  HENT:     Head:  Normocephalic and atraumatic.  Eyes:     Conjunctiva/sclera: Conjunctivae normal.  Cardiovascular:     Rate and Rhythm: Regular rhythm. Tachycardia present.     Heart sounds: Normal heart sounds.  Pulmonary:     Effort: Pulmonary effort is normal. No respiratory distress.     Breath sounds: No wheezing.  Abdominal:     General: There is no distension.  Musculoskeletal:     Cervical back: Neck supple.  Skin:    General: Skin is warm.  Capillary Refill: Capillary refill takes less than 2 seconds.     Findings: No rash.  Neurological:     Mental Status: She is alert and oriented to person, place, and time.     Motor: No abnormal muscle tone.       ____________________________________________   LABS (all labs ordered are listed, but only abnormal results are displayed)  Labs Reviewed  COMPREHENSIVE METABOLIC PANEL - Abnormal; Notable for the following components:      Result Value   Calcium 8.8 (*)    Total Protein 5.8 (*)    Albumin 3.3 (*)    All other components within normal limits  URINALYSIS, COMPLETE (UACMP) WITH MICROSCOPIC - Abnormal; Notable for the following components:   Color, Urine YELLOW (*)    APPearance CLEAR (*)    Hgb urine dipstick SMALL (*)    Leukocytes,Ua TRACE (*)    All other components within normal limits  CBC WITH DIFFERENTIAL/PLATELET  MAGNESIUM    ____________________________________________  EKG: Suspected atrial flutter, ventricular rate 148.  QRS 64, QTc 461.  No acute ST elevations.  Nonspecific ST changes. EKG 2: Normal sinus rhythm, ventricular rate 67.  PR 154, QRS 81, QTc 417.  No acute ST elevations or depressions ________________________________________  RADIOLOGY All imaging, including plain films, CT scans, and ultrasounds, independently reviewed by me, and interpretations confirmed via formal radiology reads.  ED MD interpretation:   CT Head: NAICA, small focus of air noted in cavernous sinus, likely  chronic/incidental CT C Spine: Negative CXR: Clear XR Pelvis: Negative  Official radiology report(s): CT Head Wo Contrast  Result Date: 05/16/2021 CLINICAL DATA:  Unwitnessed fall. EXAM: CT HEAD WITHOUT CONTRAST TECHNIQUE: Contiguous axial images were obtained from the base of the skull through the vertex without intravenous contrast. COMPARISON:  Head CT 04/28/2021 FINDINGS: Brain: Stable degree of atrophy and chronic small vessel ischemia no intracranial hemorrhage, mass effect, or midline shift. No hydrocephalus. The basilar cisterns are patent. No evidence of territorial infarct or acute ischemia. No extra-axial or intracranial fluid collection. Vascular: Atherosclerosis of skullbase vasculature without hyperdense vessel or abnormal calcification. Skull: No fracture or focal lesion. Sinuses/Orbits: Small amount of air in the cavernous sinus and right carotid canal without evidence of adjacent skull base fracture. No mastoid effusion or mastoid fracture. Postsurgical change of the paranasal sinuses. Bilateral cataract resection. Other: None. IMPRESSION: 1. No acute intracranial abnormality. No skull fracture. 2. Stable atrophy and chronic small vessel ischemia. 3. Small amount of air in the cavernous sinus and right carotid canal without evidence of skull base or facial bone fracture. Exact etiology is indeterminate. Electronically Signed   By: Keith Rake M.D.   On: 05/16/2021 23:04   CT Cervical Spine Wo Contrast  Result Date: 05/16/2021 CLINICAL DATA:  Unwitnessed fall. EXAM: CT CERVICAL SPINE WITHOUT CONTRAST TECHNIQUE: Multidetector CT imaging of the cervical spine was performed without intravenous contrast. Multiplanar CT image reconstructions were also generated. COMPARISON:  Most recent cervical spine CT 04/28/2021 FINDINGS: Alignment: No traumatic subluxation. Similar anterolisthesis of C3 on C4. Skull base and vertebrae: No acute fracture. Vertebral body heights are maintained. The dens  and skull base are intact. Occipital condyles and mastoid air cells are intact. Soft tissues and spinal canal: No prevertebral fluid or swelling. No visible canal hematoma. Disc levels: Multilevel degenerative disc disease and facet hypertrophy, not significantly changed from earlier this month. There is facet ankylosis of C7-T1 on the left. Upper chest: No acute findings. Other: Carotid calcifications. IMPRESSION: Multilevel  degenerative change in the cervical spine without acute fracture or subluxation. Electronically Signed   By: Keith Rake M.D.   On: 05/16/2021 23:07   DG Pelvis Portable  Result Date: 05/16/2021 CLINICAL DATA:  Unwitnessed fall. EXAM: PORTABLE PELVIS 1-2 VIEWS COMPARISON:  None. FINDINGS: Bones diffusely under mineralized. The cortical margins of the bony pelvis are intact. No fracture. Pubic symphysis and sacroiliac joints are congruent. Both femoral heads are well-seated in the respective acetabula. Tacks from prior bilateral inguinal hernia repairs. IMPRESSION: No pelvic fracture. Osteopenia/osteoporosis. Electronically Signed   By: Keith Rake M.D.   On: 05/16/2021 23:22   DG Chest Portable 1 View  Result Date: 05/16/2021 CLINICAL DATA:  Unwitnessed fall. EXAM: PORTABLE CHEST 1 VIEW COMPARISON:  Radiograph 04/28/2021 FINDINGS: Stable heart size and mediastinal contours. Decreased gaseous distension of retrocardiac hiatal hernia from prior exam. No acute airspace disease or pleural effusion. Skin fold projects over the right chest. No pneumothorax. Bones under mineralized without evidence of acute osseous abnormality. IMPRESSION: 1. No acute abnormality. 2. Decreased gaseous distension of retrocardiac hiatal hernia from prior exam. Electronically Signed   By: Keith Rake M.D.   On: 05/16/2021 23:21    ____________________________________________  PROCEDURES   Procedure(s) performed (including Critical  Care):  Procedures  ____________________________________________  INITIAL IMPRESSION / MDM / [place] / ED COURSE  As part of my medical decision making, I reviewed the following data within the [place] notes reviewed and incorporated, Old chart reviewed, Notes from prior ED visits, and [place] Controlled Substance Database       *HARLIE BUENING was evaluated in Emergency Department on 05/17/2021 for the symptoms described in the history of present illness. She was evaluated in the context of the global COVID-19 pandemic, which necessitated consideration that the patient might be at risk for infection with the SARS-CoV-2 virus that causes COVID-19. Institutional protocols and algorithms that pertain to the evaluation of patients at risk for COVID-19 are in a state of rapid change based on information released by regulatory bodies including the CDC and federal and state organizations. These policies and algorithms were followed during the patient's care in the ED.  Some ED evaluations and interventions may be delayed as a result of limited staffing during the pandemic.*     Medical Decision Making:  87 yo F here after being found down at her SNF. Unclear if she fell. On exam, pt has no signs of trauma. She arrived in AFlutter with VR 140s. Dilt given with improvement to 40-50s and return of NSR, which appear sto be baseline per review of records. Otherwise, pt is without complaints and non-toxic on exam. CT Head, C Spine and plain films reviewed, show no acute traumatic abnormality. Incidental note made of air in sinus on CT Head - likely incidental per Radiology, and has been somewhat present previously. She has no signs of basilar skull fx or significant head trauma clinically. Will f/u labs, UA, likely d/c if she remains in NSR and at baseline.  ____________________________________________  FINAL CLINICAL IMPRESSION(S) / ED DIAGNOSES  Final diagnoses:  Fall,  initial encounter  Atrial fibrillation with RVR ([place])     MEDICATIONS GIVEN DURING THIS VISIT:  Medications  lactated ringers bolus 1,000 mL (0 mLs Intravenous Stopped 05/16/21 2323)  diltiazem (CARDIZEM) injection 5 mg (5 mg Intravenous Given 05/16/21 2144)  pramipexole (MIRAPEX) tablet 0.25 mg (0.25 mg Oral Given 05/16/21 2216)     ED Discharge Orders  None       Note:  This document was prepared using Dragon voice recognition software and may include unintentional dictation errors.   Duffy Bruce, MD 05/17/21 [phone]

## 2021-05-16 NOTE — ED Triage Notes (Addendum)
Pt presents to the [facility] via EMS from [place] with c/o tachycardia and unwitnessed fall. Ems states that pt was found on the floor but does not have any obvious signs of trauma and pt denies any pain at this time. Pt found to be in tachycardic upon arrival to ED with heart rate in the 150's. EMS report hx of dementia and currently at baseline mental status.

## 2021-05-17 LAB — URINALYSIS, COMPLETE (UACMP) WITH MICROSCOPIC
Bacteria, UA: NONE SEEN
Bilirubin Urine: NEGATIVE
Glucose, UA: NEGATIVE mg/dL
Ketones, ur: NEGATIVE mg/dL
Nitrite: NEGATIVE
Protein, ur: NEGATIVE mg/dL
Specific Gravity, Urine: 1.009 (ref 1.005–1.030)
pH: 6 (ref 5.0–8.0)

## 2021-05-17 NOTE — ED Provider Notes (Signed)
12:23 AM Assumed care for off going team.   Blood pressure (!) 152/61, pulse (!) 44, temperature 98 F (36.7 C), temperature source Oral, resp. rate 16, height 5\' 4"  (1.626 m), weight 50 kg, SpO2 93 %.  See their HPI for full report but in brief pending CT imaging, labs and discharge home  CT head is negative except for small amount of air which Dr. Joellen Jersey already talked to radiology most likely secondary to IV.  Chest x-ray and pelvis x-ray was negative for fractures.  Labs are reassuring.  Patient does have a history of irregular heart rhythms and is already on Dilts and blood thinner.  UA without evidence of UTI.  Reevaluated patient.  Updated patient on her results.  Patient's heart rates are in the the 40s to 50s, sinus..  I discussed the provisional nature of ED diagnosis, the treatment so far, the ongoing plan of care, follow up appointments and return precautions with the patient and any family or support people present. They expressed understanding and agreed with the plan, discharged home.         Vanessa Tekamah, MD 05/17/21 [phone]

## 2021-05-17 NOTE — ED Notes (Signed)
Attempted to contact [facility] SNF x2, no response. Charge nurse aware.

## 2021-05-17 NOTE — Discharge Instructions (Addendum)
Her heart rate was briefly in an abnormal rhythm and she was given some medication for her and came out of that.  Her work-up was otherwise reassuring with normal CT scans, normal labs, no evidence of UTI  If she continues to have frequent falls he may want to discuss with her primary care doctor if she should remain on her blood thinner.    Return to the ER for fevers, worsening symptoms or any other concerns

## 2021-06-12 ENCOUNTER — Emergency Department: Payer: Medicare Other

## 2021-06-12 ENCOUNTER — Emergency Department (HOSPITAL_COMMUNITY): Payer: Commercial Managed Care - HMO | Attending: Emergency Medicine

## 2021-06-12 ENCOUNTER — Encounter: Payer: Self-pay | Admitting: Emergency Medicine

## 2021-06-12 ENCOUNTER — Emergency Department
Admission: EM | Admit: 2021-06-12 | Discharge: 2021-06-12 | Disposition: A | Discharge status: Home / Self Care | Payer: Medicare Other | Attending: Emergency Medicine | Admitting: Emergency Medicine

## 2021-06-12 ENCOUNTER — Other Ambulatory Visit: Payer: Self-pay

## 2021-06-12 DIAGNOSIS — Z8551 Personal history of malignant neoplasm of bladder: Secondary | ICD-10-CM | POA: Insufficient documentation

## 2021-06-12 DIAGNOSIS — I959 Hypotension, unspecified: Secondary | ICD-10-CM

## 2021-06-12 DIAGNOSIS — Z79899 Other long term (current) drug therapy: Secondary | ICD-10-CM | POA: Diagnosis not present

## 2021-06-12 DIAGNOSIS — E860 Dehydration: Secondary | ICD-10-CM

## 2021-06-12 DIAGNOSIS — G309 Alzheimer's disease, unspecified: Secondary | ICD-10-CM | POA: Insufficient documentation

## 2021-06-12 DIAGNOSIS — E861 Hypovolemia: Secondary | ICD-10-CM | POA: Insufficient documentation

## 2021-06-12 DIAGNOSIS — Z7901 Long term (current) use of anticoagulants: Secondary | ICD-10-CM | POA: Diagnosis not present

## 2021-06-12 DIAGNOSIS — I10 Essential (primary) hypertension: Secondary | ICD-10-CM | POA: Diagnosis not present

## 2021-06-12 DIAGNOSIS — I9589 Other hypotension: Secondary | ICD-10-CM

## 2021-06-12 HISTORY — DX: Supraventricular tachycardia: I47.1

## 2021-06-12 HISTORY — DX: Anxiety disorder, unspecified: F41.9

## 2021-06-12 HISTORY — DX: Dementia in other diseases classified elsewhere, unspecified severity, without behavioral disturbance, psychotic disturbance, mood disturbance, and anxiety: F02.80

## 2021-06-12 LAB — URINALYSIS, COMPLETE (UACMP) WITH MICROSCOPIC
Bacteria, UA: NONE SEEN
Bilirubin Urine: NEGATIVE
Glucose, UA: NEGATIVE mg/dL
Ketones, ur: NEGATIVE mg/dL
Nitrite: NEGATIVE
Protein, ur: NEGATIVE mg/dL
Specific Gravity, Urine: 1.017 (ref 1.005–1.030)
pH: 6 (ref 5.0–8.0)

## 2021-06-12 LAB — CBC WITH DIFFERENTIAL/PLATELET
Abs Immature Granulocytes: 0.03 10*3/uL (ref 0.00–0.07)
Basophils Absolute: 0 10*3/uL (ref 0.0–0.1)
Basophils Relative: 1 %
Eosinophils Absolute: 0 10*3/uL (ref 0.0–0.5)
Eosinophils Relative: 0 %
HCT: 36.2 % (ref 36.0–46.0)
Hemoglobin: 11.7 g/dL — ABNORMAL LOW (ref 12.0–15.0)
Immature Granulocytes: 0 %
Lymphocytes Relative: 8 %
Lymphs Abs: 0.6 10*3/uL — ABNORMAL LOW (ref 0.7–4.0)
MCH: 28.9 pg (ref 26.0–34.0)
MCHC: 32.3 g/dL (ref 30.0–36.0)
MCV: 89.4 fL (ref 80.0–100.0)
Monocytes Absolute: 0.3 10*3/uL (ref 0.1–1.0)
Monocytes Relative: 4 %
Neutro Abs: 6.3 10*3/uL (ref 1.7–7.7)
Neutrophils Relative %: 87 %
Platelets: 170 10*3/uL (ref 150–400)
RBC: 4.05 MIL/uL (ref 3.87–5.11)
RDW: 13.6 % (ref 11.5–15.5)
WBC: 7.2 10*3/uL (ref 4.0–10.5)
nRBC: 0 % (ref 0.0–0.2)

## 2021-06-12 LAB — COMPREHENSIVE METABOLIC PANEL
ALT: 18 U/L (ref 0–44)
AST: 30 U/L (ref 15–41)
Albumin: 3.2 g/dL — ABNORMAL LOW (ref 3.5–5.0)
Alkaline Phosphatase: 52 U/L (ref 38–126)
Anion gap: 8 (ref 5–15)
BUN: 22 mg/dL (ref 8–23)
CO2: 21 mmol/L — ABNORMAL LOW (ref 22–32)
Calcium: 7.9 mg/dL — ABNORMAL LOW (ref 8.9–10.3)
Chloride: 101 mmol/L (ref 98–111)
Creatinine, Ser: 0.86 mg/dL (ref 0.44–1.00)
GFR, Estimated: >60 mL/min (ref >60)
Glucose, Bld: 116 mg/dL — ABNORMAL HIGH (ref 70–99)
Potassium: 3.9 mmol/L (ref 3.5–5.1)
Sodium: 130 mmol/L — ABNORMAL LOW (ref 135–145)
Total Bilirubin: 0.6 mg/dL (ref 0.3–1.2)
Total Protein: 5.8 g/dL — ABNORMAL LOW (ref 6.5–8.1)

## 2021-06-12 LAB — CBG MONITORING, ED: Glucose-Capillary: 130 mg/dL — ABNORMAL HIGH (ref 70–99)

## 2021-06-12 LAB — TROPONIN I (HIGH SENSITIVITY): Troponin I (High Sensitivity): 15 ng/L (ref <18)

## 2021-06-12 MED ORDER — SODIUM CHLORIDE 0.9 % IV BOLUS
1000.0000 mL | Freq: Once | INTRAVENOUS | Status: AC
  Administered 2021-06-12: 1000 mL via INTRAVENOUS

## 2021-06-12 MED ORDER — ONDANSETRON HCL 4 MG/2ML IJ SOLN
4.0000 mg | Freq: Once | INTRAMUSCULAR | Status: AC
  Administered 2021-06-12: 4 mg via INTRAVENOUS
  Filled 2021-06-12: qty 2

## 2021-06-12 NOTE — ED Notes (Signed)
Transport requested [employer]

## 2021-06-12 NOTE — ED Triage Notes (Signed)
Arrives from [facility] unit via [facility].  Initial call for hypotension.  EMS initial VS BP:  84/60 P:  150.  Per EMS report, last BP:  142/68  P:  62.  Patient more altered than baseline.  PERRLA.  20g IV to left forearm.

## 2021-06-12 NOTE — ED Notes (Signed)
Spoke with Daughter Syrai Gladwin regarding mother care.

## 2021-06-12 NOTE — ED Notes (Signed)
Pt cleansed by Minette Brine, EDT and this Probation officer. Purewick in place, new brief and clean chux applied at this time.

## 2021-06-12 NOTE — ED Notes (Signed)
Patient transported to X-ray 

## 2021-06-13 NOTE — ED Provider Notes (Signed)
[facility] Emergency Department Provider Note   ____________________________________________   Event Date/Time   First MD Initiated Contact with Patient 06/12/21 1801     (approximate)  I have reviewed the triage vital signs and the nursing notes.   HISTORY  Chief Complaint Hypotension    HPI Paige Vasquez is a 87 y.o. female with below stated past medical history including significant history of dementia who presents from a long-term care facility with concerns of hypotension and worsening mental status.  Per EMS they were told that patient was found to have a pressure of 84/60 with a pulse of 150.  Per EMS report patient's last blood pressure was 142/68 with a pulse of 62.  Unclear as to what patient's baseline is however further history and review of systems are unable to be obtained at this time.         Past Medical History:  Diagnosis Date   Alzheimer disease ([place])    Anxiety    Cystitis    GERD (gastroesophageal reflux disease)    HTN (hypertension)    RLS (restless legs syndrome)    Supraventricular tachycardia [facility])     Patient Active Problem List   Diagnosis Date Noted   Benign essential hypertension 05/17/2020   DVT (deep venous thrombosis) ([place]) 04/28/2020   Leukocytosis 04/28/2020   Depression with anxiety 04/28/2020   Diarrhea 04/28/2020   Mild protein-calorie malnutrition ([place]) 01/31/2019   Bradycardia 01/22/2019   History of patellar fracture 09/03/2018   Contusion of left knee 07/20/2018   UTI (urinary tract infection) 07/02/2018   Falls 07/02/2018   HTN (hypertension) 07/02/2018   GERD (gastroesophageal reflux disease) 07/02/2018   RLS (restless legs syndrome) 07/02/2018   Alzheimer's dementia with behavioral disturbance ([place]) 06/15/2018   Paroxysmal SVT (supraventricular tachycardia) ([place]) 09/22/2017   Scoliosis of thoracic spine 12/09/2016   Medicare annual wellness visit, initial 08/11/2016   Chronic  osteoarthritis 06/16/2016   Interstitial cystitis 02/13/2016   Dysuria 10/25/2015   Incomplete emptying of bladder 09/27/2015   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 Authorizing Provider  acetaminophen (TYLENOL) 500 MG tablet Take 1,000 mg by mouth every 6 (six) hours as needed for mild pain or fever.    [provider]  apixaban (ELIQUIS) 5 MG TABS tablet Take 2 tablets (10 mg total) by mouth 2 (two) times daily for 12 doses. 04/30/20 05/06/20  Sharen Hones, MD  apixaban (ELIQUIS) 5 MG TABS tablet Take 1 tablet (5 mg total) by mouth 2 (two) times daily. 05/05/20   Sharen Hones, MD  cholecalciferol (VITAMIN D) 25 MCG (1000 UT) tablet Take 2,000 Units by mouth daily.     [provider]  diltiazem (CARDIZEM CD) 180 MG 24 hr capsule Take 180 mg by mouth daily.    [provider]  ferrous sulfate 325 (65 FE) MG tablet Take 325 mg by mouth daily.    [provider]  FLUoxetine (PROZAC) 20 MG tablet Take 20 mg by mouth daily.     [provider]  gabapentin (NEURONTIN) 100 MG capsule Take 100 mg by mouth at bedtime.    [provider]  Liniments (BLUE-EMU SUPER STRENGTH) CREA Apply 1 application  topically every 12 (twelve) hours as needed (knee pain).    [provider]  melatonin 3 MG TABS tablet Take 6 mg by mouth at bedtime.    [provider]  NONFORMULARY OR COMPOUNDED ITEM Apply 1 application topically in the morning, at noon, and at bedtime. ** LORAZEPAM 1mg /ml topical gel**    [provider]  NONFORMULARY OR COMPOUNDED ITEM Apply 1 application topically 4 (four) times daily as needed (agitation). ** LORAZEPAM 1mg /ml topical gel**    [provider]  polyethylene glycol (MIRALAX / GLYCOLAX) 17 g packet Take 17 g by mouth daily.    [provider]  pramipexole (MIRAPEX) 0.25 MG tablet Take 0.25 mg by mouth 2 (two) times daily.    [provider]  QUEtiapine (SEROQUEL) 50 MG tablet Take 50 mg by mouth 3 (three) times daily.    [provider]  Skin Protectants, Misc. (EUCERIN) cream Apply topically as needed for dry skin.    [provider]  traMADol (ULTRAM) 50 MG tablet Take by mouth every 6 (six) hours as needed.    [provider]  traZODone (DESYREL) 50 MG tablet Take 25 mg by mouth every 12 (twelve) hours as needed (anxiety).    [provider]  vitamin B-12 (CYANOCOBALAMIN) 1000 MCG tablet Take 1,000 mcg by mouth daily.    [provider]    Allergies Patient has no known allergies.  Family History  Problem Relation Age of Onset   Colon cancer Mother    Emphysema Father     Social History Social History   Tobacco Use   Smoking status: Never   Smokeless tobacco: Never    Review of Systems Unable to assess ____________________________________________   PHYSICAL EXAM:  VITAL SIGNS: ED Triage Vitals  Enc Vitals Group     BP 06/12/21 1802 (!) 90/45     Pulse Rate 06/12/21 1802 63     Resp 06/12/21 1802 16     Temp 06/12/21 1802 98.4 F (36.9 C)     Temp Source 06/12/21 1802 Oral     SpO2 06/12/21 1802 97 %     Weight 06/12/21 1806 110 lb 3.7 oz (50 kg)     Height 06/12/21 1806 5\' 4"  (1.626 m)     Head Circumference --      Peak Flow --      Pain Score --      Pain Loc --      Pain Edu? --      Excl. in [place]? --    Constitutional: Alert and disoriented.  Cachectic ill-appearing elderly Caucasian female in no acute distress. Eyes: Conjunctivae are normal. PERRL. Head: Atraumatic. Nose: No congestion/rhinnorhea. Mouth/Throat: Mucous membranes are dry. Neck: No stridor Cardiovascular: Grossly normal heart sounds.  Good peripheral  circulation. Respiratory: Normal respiratory effort.  No retractions. Gastrointestinal: Soft and nontender. No distention. Musculoskeletal: No obvious deformities Neurologic:  Normal speech and language. No gross focal neurologic deficits are appreciated. Skin:  Skin is warm and dry. No rash noted. Psychiatric: Cooperative  ____________________________________________   LABS (all labs ordered are listed, but only abnormal results are displayed)  Labs Reviewed  COMPREHENSIVE METABOLIC PANEL - Abnormal; Notable for the following components:      Result Value   Sodium 130 (*)    CO2 21 (*)    Glucose, Bld 116 (*)    Calcium 7.9 (*)    Total Protein 5.8 (*)    Albumin 3.2 (*)  All other components within normal limits  CBC WITH DIFFERENTIAL/PLATELET - Abnormal; Notable for the following components:   Hemoglobin 11.7 (*)    Lymphs Abs 0.6 (*)    All other components within normal limits  URINALYSIS, COMPLETE (UACMP) WITH MICROSCOPIC - Abnormal; Notable for the following components:   Color, Urine YELLOW (*)    APPearance HAZY (*)    Hgb urine dipstick SMALL (*)    Leukocytes,Ua TRACE (*)    All other components within normal limits  CBG MONITORING, ED - Abnormal; Notable for the following components:   Glucose-Capillary 130 (*)    All other components within normal limits  TROPONIN I (HIGH SENSITIVITY)   ____________________________________________  EKG  ED ECG REPORT I, Naaman Plummer, the attending physician, personally viewed and interpreted this ECG.  Date: 06/13/2021 EKG Time: 1812 Rate: 56 Rhythm: normal sinus rhythm QRS Axis: normal Intervals: normal ST/T Wave abnormalities: normal Narrative Interpretation: no evidence of acute ischemia  ____________________________________________  RADIOLOGY  ED MD interpretation: One-view portable chest x-ray shows no evidence of acute abnormalities including no pneumonia, pneumothorax, or widened  mediastinum  Official radiology report(s): DG Chest Port 1 View  Result Date: 06/12/2021 CLINICAL DATA:  87 year old female with hypertension. EXAM: PORTABLE CHEST 1 VIEW COMPARISON:  Chest radiograph dated 05/16/2021 FINDINGS: No focal consolidation, pleural effusion, or pneumothorax. There is chronic intra coarsening and bronchitic changes. Mild cardiomegaly. Atherosclerotic calcification of the aorta. Osteopenia with degenerative changes of the spine. No acute osseous pathology. IMPRESSION: No acute cardiopulmonary process. Electronically Signed   By: Anner Crete M.D.   On: 06/12/2021 20:03    ____________________________________________   PROCEDURES  Procedure(s) performed (including Critical Care):  .1-3 Lead EKG Interpretation  Date/Time: 06/13/2021 4:13 PM Performed by: Naaman Plummer, MD Authorized by: Naaman Plummer, MD     Interpretation: normal     ECG rate:  78   ECG rate assessment: normal     Rhythm: sinus rhythm     Ectopy: none     Conduction: normal     ____________________________________________   INITIAL IMPRESSION / ASSESSMENT AND PLAN / ED COURSE  As part of my medical decision making, I reviewed the following data within the [place] notes reviewed and incorporated, Labs reviewed, EKG interpreted, Old chart reviewed, Radiograph reviewed and Notes from prior ED visits reviewed and incorporated        Patient is a 87 year old female who presents via EMS from a long-term care facility with complaints of hypotension and worsening altered mental status.  Unclear as to what patient's baseline mental status is however she does seem to be sundowning throughout her emergency department course.  Patient showed signs of dehydration clinically and was empirically given 2 L of NS with improvement of her blood pressure.  Laboratory evaluation shows no red flags except for possible mild malnutrition with low protein and albumin  concentrations.  Will encourage increased p.o. intake for patient including hydration.  Patient has been reassessed and is stable for discharge at this time.  Facility informed of patient being transferred back .  Dispo: Discharge with PCP follow-up     ____________________________________________   FINAL CLINICAL IMPRESSION(S) / ED DIAGNOSES  Final diagnoses:  Dehydration  Hypotension due to hypovolemia     ED Discharge Orders     None        Note:  This document was prepared using Dragon voice recognition software and may include unintentional dictation errors.  Naaman Plummer, MD 06/13/21 [id number]

## 2022-07-01 ENCOUNTER — Emergency Department
Admission: EM | Admit: 2022-07-01 | Discharge: 2022-07-01 | Disposition: A | Discharge status: Skilled Nursing Facility | Attending: Emergency Medicine | Admitting: Emergency Medicine

## 2022-07-01 ENCOUNTER — Other Ambulatory Visit: Payer: Self-pay

## 2022-07-01 DIAGNOSIS — S0181XA Laceration without foreign body of other part of head, initial encounter: Secondary | ICD-10-CM | POA: Diagnosis not present

## 2022-07-01 DIAGNOSIS — S0993XA Unspecified injury of face, initial encounter: Secondary | ICD-10-CM | POA: Diagnosis present

## 2022-07-01 DIAGNOSIS — W19XXXA Unspecified fall, initial encounter: Secondary | ICD-10-CM | POA: Insufficient documentation

## 2022-07-01 DIAGNOSIS — S0083XA Contusion of other part of head, initial encounter: Secondary | ICD-10-CM | POA: Insufficient documentation

## 2022-07-01 DIAGNOSIS — F0390 Unspecified dementia without behavioral disturbance: Secondary | ICD-10-CM | POA: Insufficient documentation

## 2022-07-01 NOTE — ED Notes (Signed)
Report given to [employer]

## 2022-07-01 NOTE — Discharge Instructions (Signed)
Please keep the wound on her forehead clean and dry.  The Steri-Strips will fall off eventually.  If she will tolerate it, you can use cold packs to reduce the swelling.  Please have her follow-up with her PCP.  Continue on her regular medications.

## 2022-07-01 NOTE — ED Provider Notes (Signed)
[facility] Provider Note    Event Date/Time   First MD Initiated Contact with Patient 07/01/22 [phone]     (approximate)   History   Fall   HPI Level 5 caveat:  history/ROS limited by chronic dementia  Paige Vasquez is a 88 y.o. female whose medical history includes advanced dementia and who comes with a DNR form from her memory care nursing facility.  She presents with EMS for evaluation after an unwitnessed fall.  Reportedly staff found her down, having apparently struck the right side of her forehead.  She has a large hematoma on her forehead with a small laceration in the middle of it.  She is reportedly at her baseline mental status, awake and alert but not responding readily to questions.  She is indicating no pain or distress.     Physical Exam   ED Triage Vitals  Enc Vitals Group     BP 07/01/22 0621 (!) 160/43     Pulse Rate 07/01/22 0621 (!) 45     Resp 07/01/22 0621 (!) 24     Temp 07/01/22 0621 97.8 F (36.6 C)     Temp Source 07/01/22 0621 Oral     SpO2 07/01/22 0621 99 %     Weight 07/01/22 0622 40.5 kg (89 lb 4.6 oz)     Height 07/01/22 0622 1.626 m ('5\' 4"'$ )     Head Circumference --      Peak Flow --      Pain Score --      Pain Loc --      Pain Edu? --      Excl. in [place]? --       Most recent vital signs: Vitals:   07/01/22 0800 07/01/22 0830  BP: (!) 141/39 (!) 134/51  Pulse: 66 60  Resp: 20 20  Temp:    SpO2: 99% 98%     General: Awake, alert.  Typically does not answer questions but will answer my questions in a soft but clearly intelligible voice.  Denies pain. Head:  Patient has a large hematoma on the right side of her forehead with a small laceration in the middle, bleeding well controlled.  Minimal tenderness to palpation.  No palpable skull fracture. CV:  Good peripheral perfusion.  Bradycardia but with normal heart sounds. Resp:  Normal effort.  Lungs are clear to auscultation bilaterally. Abd:  No distention.   No tenderness to palpation of the abdomen. Other:  Patient has no tenderness to palpation of her cervical spine.  She has no pain or tenderness with manipulation of her arms and her legs.  She denies pain throughout the exam.  Her pelvis is stable and nontender.   ED Results / Procedures / Treatments    .Marland KitchenLaceration Repair  Date/Time: 07/01/2022 6:38 AM  Performed by: Hinda Kehr, MD Authorized by: Hinda Kehr, MD   Consent:    Consent obtained:  Emergent situation Universal protocol:    Patient identity confirmed:  Arm band Anesthesia:    Anesthesia method:  None Laceration details:    Location:  Face   Face location:  Forehead   Length (cm):  0.3 Exploration:    Contaminated: no   Treatment:    Visualized foreign bodies/material removed: no   Skin repair:    Repair method:  Steri-Strips   Number of Steri-Strips:  2 Approximation:    Approximation:  Close Repair type:    Repair type:  Simple Post-procedure details:  Dressing:  Open (no dressing)   Procedure completion:  Tolerated well, no immediate complications    MEDICATIONS ORDERED IN ED: Medications - No data to display   IMPRESSION / MDM / [place] / ED COURSE  I reviewed the triage vital signs and the nursing notes.                              Differential diagnosis includes, but is not limited to, contusion, fracture, dislocation, spinal injury, intracranial bleeding.  Patient's presentation is most consistent with acute presentation with potential threat to life or bodily function.  Patient's vital signs are notable for bradycardia but otherwise normal.  She is awake and alert but her dementia makes it difficult for her to respond.  However she will answer my questions, stating that she is not in pain, and confirming that she is cold and would like a blanket.  Given her baseline level of function and her DNR order, I called first to her husband where there is no answer.  I then called  her daughter, [name], who is listed as her contact person and CHL.  She confirmed that the patient's husband is currently hospitalized as well.  She stated that she and her sister, [name], have shared powers of attorney.  I explained that her evaluation is reassuring that I cannot rule out acute intracranial bleed or C-spine fracture even though I have a very low suspicion.  However I suggested that if the family would not want aggressive intervention even if we were to find a potentially life-threatening illness, we may want to forego additional evaluation so as not to upset the patient or make her uncomfortable.  [name] very much agreed with the plan and indicated that they do not want any aggressive interventions and in fact have discussed in the past with the facility that she should not be sent to the hospital at all.  I think this is very reasonable and appropriate given the patient's level of cognitive and physical decline.  Her daughter [name] understands that she may have an emergent medical condition such as an acute intracranial bleed, but there is little to be done about a short of aggressive intervention and the family does not want that.  She also assures me that her mother would not want that if she could speak for herself.  I placed Steri-Strips on the forehead laceration/contusion and we will discharge back to her facility.   Clinical Course as of 07/01/22 0929  Tue Jul 01, 2022  0655 SNF is balking at the discharge, stating that they spoke with the other daughter, [name], who wanted "her to be evaluated".  I called [name] back and discuss it again.  She says that she understands the patient HAS been evaluated and that we agreed together to not proceed with imaging.  She also believes that her sister will agree.  In an attempt to avoid conflict with the nursing facility, I called the phone number for [name] to touch base with her as well.  I left a HIPAA compliant voicemail message for her  to call me back. [CF]  0800 Spoke again by phone with [name].  She was able to get in touch with her sister, [name].  She reports to me that they discussed the situation and they are both in strong agreement that they do not want additional diagnostic evaluation of their mother and would prefer she go back to her  facility.  They confirmed that she is under the care of hospice and that they have talked in the past with hospice about how they do not want acute or aggressive interventions.  I provided reassurance that her evaluation is reassuring this morning and that she will be transported by EMS back to her facility. [CF]    Clinical Course User Index [CF] Hinda Kehr, MD     FINAL CLINICAL IMPRESSION(S) / ED DIAGNOSES   Final diagnoses:  Fall, initial encounter  Forehead contusion, initial encounter  Forehead laceration, initial encounter     Rx / DC Orders   ED Discharge Orders     None        Note:  This document was prepared using Dragon voice recognition software and may include unintentional dictation errors.   Hinda Kehr, MD 07/01/22 [phone]

## 2022-07-01 NOTE — ED Triage Notes (Signed)
Patient arrives via EMS from [facility] care for unwitnessed fall. Hematoma to right forehead. Patient with spontaneous regular respirations, alert but not answering questions or following commands.

## 2022-07-01 NOTE — Progress Notes (Signed)
Manufacturing engineer [facility])       This patient is a current hospice patient with [facility], admitted with a terminal diagnosis end stage cerebral vascular disease   [facility] will continue to follow for any discharge planning needs and to coordinate continuation of hospice care.     Please call with any questions/concerns.    Thank you for the opportunity to participate in this patient's care.  Daphene Calamity, MSW [facility] Liaison  [phone]

## 2022-07-01 NOTE — ED Notes (Signed)
Pt trying to get out bed despite bed alarm, multiple redirects. Pt trying to kick this RN to get out of bed. 1:1 sitter at bedside for safety

## 2023-01-20 ENCOUNTER — Emergency Department
Admission: EM | Admit: 2023-01-20 | Discharge: 2023-01-20 | Disposition: A | Discharge status: Home / Self Care | Attending: Emergency Medicine | Admitting: Emergency Medicine

## 2023-01-20 ENCOUNTER — Other Ambulatory Visit: Payer: Self-pay

## 2023-01-20 ENCOUNTER — Encounter: Payer: Self-pay | Admitting: *Deleted

## 2023-01-20 ENCOUNTER — Emergency Department

## 2023-01-20 DIAGNOSIS — S0990XA Unspecified injury of head, initial encounter: Secondary | ICD-10-CM

## 2023-01-20 DIAGNOSIS — Y92129 Unspecified place in nursing home as the place of occurrence of the external cause: Secondary | ICD-10-CM | POA: Insufficient documentation

## 2023-01-20 DIAGNOSIS — W050XXA Fall from non-moving wheelchair, initial encounter: Secondary | ICD-10-CM | POA: Insufficient documentation

## 2023-01-20 DIAGNOSIS — Z23 Encounter for immunization: Secondary | ICD-10-CM | POA: Insufficient documentation

## 2023-01-20 DIAGNOSIS — G309 Alzheimer's disease, unspecified: Secondary | ICD-10-CM | POA: Insufficient documentation

## 2023-01-20 DIAGNOSIS — S0101XA Laceration without foreign body of scalp, initial encounter: Secondary | ICD-10-CM | POA: Insufficient documentation

## 2023-01-20 DIAGNOSIS — I10 Essential (primary) hypertension: Secondary | ICD-10-CM | POA: Diagnosis not present

## 2023-01-20 MED ORDER — TETANUS-DIPHTH-ACELL PERTUSSIS 5-2.5-18.5 LF-MCG/0.5 IM SUSY
0.5000 mL | PREFILLED_SYRINGE | Freq: Once | INTRAMUSCULAR | Status: AC
  Administered 2023-01-20: 0.5 mL via INTRAMUSCULAR

## 2023-01-20 NOTE — ED Triage Notes (Addendum)
Pt brought in via ems from the [facility].  Pt has a laceration to back of head.   Pt fell getting out of her wheelchair. No loc  no vomiting.  Pt in recliner in lobby.  Pt alert. Hx alz. Disease

## 2023-01-20 NOTE — ED Provider Notes (Signed)
[facility] Provider Note    Event Date/Time   First MD Initiated Contact with Patient 01/20/23 1641     (approximate)   History   Laceration and Fall   HPI  Paige Vasquez is a 89 y.o. female with a past medical history of Alzheimer's disease, restless leg syndrome, hypertension, GERD, who presents today for evaluation after a fall.  Patient fell getting out of her wheelchair at her nursing home.  She struck her head but there was no loss of consciousness.  She has not had any nausea or vomiting.  Patient is at her baseline according to nursing home staff.  Patient Active Problem List   Diagnosis Date Noted   Benign essential hypertension 05/17/2020   DVT (deep venous thrombosis) ([place]) 04/28/2020   Leukocytosis 04/28/2020   Depression with anxiety 04/28/2020   Diarrhea 04/28/2020   Mild protein-calorie malnutrition ([place]) 01/31/2019   Bradycardia 01/22/2019   History of patellar fracture 09/03/2018   Contusion of left knee 07/20/2018   UTI (urinary tract infection) 07/02/2018   Falls 07/02/2018   HTN (hypertension) 07/02/2018   GERD (gastroesophageal reflux disease) 07/02/2018   RLS (restless legs syndrome) 07/02/2018   Alzheimer's dementia with behavioral disturbance ([place]) 06/15/2018   Paroxysmal SVT (supraventricular tachycardia) 09/22/2017   Scoliosis of thoracic spine 12/09/2016   Medicare annual wellness visit, initial 08/11/2016   Chronic osteoarthritis 06/16/2016   Interstitial cystitis 02/13/2016   Dysuria 10/25/2015   Incomplete emptying of bladder 09/27/2015   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      Excl. in [place]?     Most recent vital signs: Vitals:   01/20/23 1600 01/20/23 1730  BP: (!) 155/94 (!) 176/112  Pulse: (!) 52 83  Resp: 18 (!) 26  Temp: 98.3 F (36.8 C)   SpO2: 95% 100%    Physical Exam Vitals and nursing note reviewed.  Constitutional:      General: Awake and alert.  Does not appear to be in any distress, however she is reaching out with her hands in order to hold my hands    Appearance: Normal appearance. The patient is normal weight.  HENT:     Head: Normocephalic.  2.5 cm laceration to back of head, no underlying hematoma.  No Battle sign or raccoon eyes    Mouth: Mucous membranes are moist.  Eyes:     General: PERRL. Normal EOMs        Right eye: No discharge.        Left eye: No discharge.     Conjunctiva/sclera: Conjunctivae normal.  Cardiovascular:     Rate and Rhythm: Normal rate and regular rhythm.     Pulses: Normal pulses.  Pulmonary:     Effort: Pulmonary effort is normal. No respiratory distress.     Breath sounds: Normal breath sounds.  No chest wall tenderness or ecchymosis Abdominal:     Abdomen is soft. There is no abdominal tenderness. No rebound or guarding. No distention. Musculoskeletal:        General: No swelling. Normal range of motion.     Cervical back: Normal range of motion and neck supple.  No cervical spine tenderness Patient is moving all of her extremities actively, does not appear to be in any pain.  Normal passive range of motion of bilateral hips, knees, ankles without pain.  No deformity, erythema, ecchymosis, or contusions noted. Skin:    General: Skin is warm and dry.     Capillary Refill: Capillary refill takes less than 2 seconds.  Neurological:     Mental Status: The patient is awake and alert.  Face symmetric, moving all 4 extremities   ED Results / Procedures / Treatments   Labs (all labs  ordered are listed, but only abnormal results are displayed) Labs Reviewed - No data to display   EKG     RADIOLOGY     PROCEDURES:  Critical Care performed:   Procedures   MEDICATIONS ORDERED IN ED: Medications  Tdap (BOOSTRIX) injection 0.5 mL (0.5 mLs Intramuscular Given 01/20/23 1730)     IMPRESSION / MDM / [place] / ED COURSE  I reviewed the triage vital signs and the nursing notes.   Differential diagnosis includes, but is not limited to, laceration, intracranial hemorrhage, skull fracture, contusion.  Patient is awake and alert, hemodynamically stable and afebrile.  She does not appear to be in any pain, though she is not readily answering questions, which per chart review appears to be her baseline.  Patient is moving all of her extremities and does not appear to be in any discomfort.  There is no obvious deformity.  Per [place] criteria, CT head and neck ordered.  However, patient will not sit still for the scan which appears to be baseline per chart review. Tech at bedside given fall risk. I am reluctant to medicate her without speaking to her family about whether or not she/they wants a CT scan to be done given that no intervention would be done given her DNR status.  I called both of her daughters, though there was no answer on either phone.  I left a HIPAA compliant voicemail.  Both daughters subsequently returned my call.  I explained that patient's evaluation is reassuring and that she appears to be at her baseline however I cannot definitively rule out an acute intracranial bleed or cervical spine injury without obtaining CT scans.  I currently have a low suspicion.  However, we discussed that the family would not want aggressive intervention even if we were to find a life-threatening illness, and therefore both [name] and [name] agree that they do not want to obtain CT imaging.  Both [name] and [place] very much agreed with the plan and reports that they  would only like me to close the laceration and not give the patient any medications or obtain CT imaging.  I explained to them that given the location of the laceration and the depth, the best way to close this would be staples which would have to subsequently be removed and they understand and agree.  [name] and [place] understand that way we may be missing a potentially life-threatening injury without obtaining CT scans/advanced imaging, and today both  assured me that her mother would not want any aggressive intervention were the CT scans to be positive.  Laceration was irrigated and closed with staples.  Patient was brought back to her facility via EMS.  Case was discussed with Dr. Charna Archer who agrees with assessment and plan.   Patient's presentation is most consistent with acute illness / injury with system symptoms.   Clinical Course as of 01/20/23 2003  Tue Jan 20, 2023  1743 Attempted to call [name] and [name], contacts listed in chart. No answer. Left HIPAA compliant voicemail [JP]  1800 Discussed with [name], decision was made to not to undergo CT imaging, and to close the laceration and sent her back to her care home only.  She understands that we may be missing an injury to the head or neck, daughter understands and does not want to undergo imaging [JP]    Clinical Course User Index [JP] Harry Bark, Clarnce Flock, PA-C     FINAL CLINICAL IMPRESSION(S) / ED DIAGNOSES   Final diagnoses:  Injury of head, initial encounter  Scalp laceration, initial encounter     Rx / DC Orders   ED Discharge Orders     None        Note:  This document was prepared using Dragon voice recognition software and may include unintentional dictation errors.   Emeline Gins 01/20/23 Aviva Kluver, MD 01/20/23 2252

## 2023-01-20 NOTE — Discharge Instructions (Signed)
After discussion with both [name] and [name], decision was made to not undergo CT head or neck.  As they understand, this means that we cannot evaluate for intracranial bleeding or a cervical spine injury. The laceration on her scalp was repaired with staples. Keep the area clean and dry.  Wash multiple times per day with soap and water.  Do not go into the ocean or swimming pool.  Return to the emergency department or your primary care physician's office in 7 days for suture removal.  Return to the emergency department for:  -- Fever > 100.26F -- Increase pain in the wound -- Increase redness and swelling -- Pus coming from the wound -- Wound bleeds more than a small amount or it does not stop -- Wound edges come apart -- Severe pain -- Weakness or numbness in the affected area  Or any other new or worsening symptoms. It was a pleasure caring for you.

## 2023-01-20 NOTE — ED Notes (Signed)
This tech was assigned to sit with pt. PT continually attempting to crawl out of bed and grab onto this tech yelling "help me." Pt unable to explain what she needs help with and keeps yelling "help me."  This tech keeps carefully repositioning pt in bed to prevent her from crawling out.  Pt repeatedly swinging at this tech as this tech attempts to keep her in bed.

## 2023-01-20 NOTE — ED Notes (Signed)
This RN to bedside from [place] room when called by Dr. Charna Archer. Dr. Laurann Montana to this RN that he believes the pt. In the next room has fallen, and could I help get her up. Upon entering room, pt. Is on the floor in front of stretcher, on her back, sitting herself up. Both pt's siderails are up, bed is in locked, low position, and monitoring cables are stretched to the end of the bed, with some still attached to pt. Dr. Charna Archer and this RN assessed pt on the floor, pt. Moving all 4 limbs, and reaching to staff asking, "can you help me, I need you to help me." Repeatedly. This RN and Dr. Charna Archer lifted pt. Back into stretcher, pt. Immediately attempts to scooch to bottom of stretcher, attempting to get up again. Pt's primary provider, Eliezer Lofts, [place], and primary RN, Vaughan Basta notified of pt. Fall. This RN stays at bedside, notifies charge of pt. fall, and waits for PCT to arrive at beside to be pt. Safety observer.

## 2023-01-20 NOTE — Progress Notes (Signed)
[facility] ED [address] [facility])       This patient is a current hospice patient with [facility], with a terminal diagnosis of Cerebrovascular Disease.     [facility] will continue to follow for any discharge planning needs and to coordinate continuation of hospice care.   Please don't hesitate to call with any Hospice related questions or concerns.    Thank you for the opportunity to participate in this patient's care. Jhonnie Garner, Therapist, sports, BSN [facility] Liaison [phone]

## 2023-04-22 DEATH — deceased
